# Patient Record
Sex: Male | Born: 1970
Health system: Southern US, Community
[De-identification: ages and names within clinical notes are randomized; demographics above are authoritative.]

## PROBLEM LIST (undated history)

## (undated) DIAGNOSIS — Z87442 Personal history of urinary calculi: Secondary | ICD-10-CM

## (undated) DIAGNOSIS — S8991XA Unspecified injury of right lower leg, initial encounter: Secondary | ICD-10-CM

## (undated) DIAGNOSIS — H7290 Unspecified perforation of tympanic membrane, unspecified ear: Secondary | ICD-10-CM

## (undated) DIAGNOSIS — E785 Hyperlipidemia, unspecified: Secondary | ICD-10-CM

## (undated) DIAGNOSIS — Z91030 Bee allergy status: Secondary | ICD-10-CM

## (undated) HISTORY — PX: EYE SURGERY: SHX253

## (undated) HISTORY — DX: Bee allergy status: Z91.030

## (undated) HISTORY — DX: Unspecified injury of right lower leg, initial encounter: S89.91XA

## (undated) HISTORY — DX: Unspecified perforation of tympanic membrane, unspecified ear: H72.90

## (undated) HISTORY — DX: Personal history of urinary calculi: Z87.442

## (undated) HISTORY — DX: Hyperlipidemia, unspecified: E78.5

---

## 1998-05-11 ENCOUNTER — Emergency Department (HOSPITAL_COMMUNITY): Admission: EM | Admit: 1998-05-11 | Discharge: 1998-05-12 | Payer: Self-pay | Admitting: Emergency Medicine

## 1998-05-12 ENCOUNTER — Encounter: Payer: Self-pay | Admitting: Emergency Medicine

## 2001-03-05 ENCOUNTER — Emergency Department (HOSPITAL_COMMUNITY): Admission: EM | Admit: 2001-03-05 | Discharge: 2001-03-05 | Payer: Self-pay | Admitting: Emergency Medicine

## 2003-10-05 ENCOUNTER — Emergency Department (HOSPITAL_COMMUNITY): Admission: EM | Admit: 2003-10-05 | Discharge: 2003-10-05 | Payer: Self-pay

## 2004-06-07 ENCOUNTER — Ambulatory Visit: Payer: Self-pay | Admitting: Internal Medicine

## 2004-12-29 ENCOUNTER — Ambulatory Visit: Payer: Self-pay | Admitting: Internal Medicine

## 2005-01-03 ENCOUNTER — Ambulatory Visit: Payer: Self-pay | Admitting: Internal Medicine

## 2005-12-01 ENCOUNTER — Ambulatory Visit: Payer: Self-pay | Admitting: Internal Medicine

## 2007-03-17 ENCOUNTER — Encounter: Payer: Self-pay | Admitting: Internal Medicine

## 2007-10-24 ENCOUNTER — Telehealth: Payer: Self-pay | Admitting: Internal Medicine

## 2007-12-12 ENCOUNTER — Ambulatory Visit: Payer: Self-pay | Admitting: Internal Medicine

## 2007-12-12 DIAGNOSIS — M25529 Pain in unspecified elbow: Secondary | ICD-10-CM | POA: Insufficient documentation

## 2007-12-12 DIAGNOSIS — F17200 Nicotine dependence, unspecified, uncomplicated: Secondary | ICD-10-CM | POA: Insufficient documentation

## 2007-12-12 DIAGNOSIS — E785 Hyperlipidemia, unspecified: Secondary | ICD-10-CM | POA: Insufficient documentation

## 2009-07-11 HISTORY — PX: TYMPANOPLASTY: SHX33

## 2009-07-13 ENCOUNTER — Ambulatory Visit: Payer: Self-pay | Admitting: Internal Medicine

## 2009-07-13 LAB — CONVERTED CEMR LAB
ALT: 38 units/L (ref 0–53)
AST: 24 units/L (ref 0–37)
Albumin: 4.1 g/dL (ref 3.5–5.2)
Alkaline Phosphatase: 60 units/L (ref 39–117)
BUN: 15 mg/dL (ref 6–23)
Basophils Absolute: 0.1 10*3/uL (ref 0.0–0.1)
Basophils Relative: 0.6 % (ref 0.0–3.0)
Bilirubin Urine: NEGATIVE
Bilirubin, Direct: 0 mg/dL (ref 0.0–0.3)
CO2: 28 meq/L (ref 19–32)
Calcium: 9.2 mg/dL (ref 8.4–10.5)
Chloride: 105 meq/L (ref 96–112)
Cholesterol: 239 mg/dL — ABNORMAL HIGH (ref 0–200)
Creatinine, Ser: 1 mg/dL (ref 0.4–1.5)
Direct LDL: 143.4 mg/dL
Eosinophils Absolute: 0.2 10*3/uL (ref 0.0–0.7)
Eosinophils Relative: 2.2 % (ref 0.0–5.0)
GFR calc non Af Amer: 88.09 mL/min (ref >60)
Glucose, Bld: 89 mg/dL (ref 70–99)
HCT: 45.7 % (ref 39.0–52.0)
HDL: 43.3 mg/dL (ref >39.00)
Hemoglobin, Urine: NEGATIVE
Hemoglobin: 15.5 g/dL (ref 13.0–17.0)
Ketones, ur: NEGATIVE mg/dL
Leukocytes, UA: NEGATIVE
Lymphocytes Relative: 31.6 % (ref 12.0–46.0)
Lymphs Abs: 2.9 10*3/uL (ref 0.7–4.0)
MCHC: 34 g/dL (ref 30.0–36.0)
MCV: 91 fL (ref 78.0–100.0)
Monocytes Absolute: 1 10*3/uL (ref 0.1–1.0)
Monocytes Relative: 10.7 % (ref 3.0–12.0)
Neutro Abs: 5 10*3/uL (ref 1.4–7.7)
Neutrophils Relative %: 54.9 % (ref 43.0–77.0)
Nitrite: NEGATIVE
Platelets: 205 10*3/uL (ref 150.0–400.0)
Potassium: 3.7 meq/L (ref 3.5–5.1)
RBC: 5.02 M/uL (ref 4.22–5.81)
RDW: 12.5 % (ref 11.5–14.6)
Sodium: 141 meq/L (ref 135–145)
Specific Gravity, Urine: >=1.03 (ref 1.000–1.030)
TSH: 1.42 microintl units/mL (ref 0.35–5.50)
Total Bilirubin: 0.7 mg/dL (ref 0.3–1.2)
Total CHOL/HDL Ratio: 6
Total Protein, Urine: NEGATIVE mg/dL
Total Protein: 7.3 g/dL (ref 6.0–8.3)
Triglycerides: 267 mg/dL — ABNORMAL HIGH (ref 0.0–149.0)
Urine Glucose: NEGATIVE mg/dL
Urobilinogen, UA: 0.2 (ref 0.0–1.0)
VLDL: 53.4 mg/dL — ABNORMAL HIGH (ref 0.0–40.0)
WBC: 9.2 10*3/uL (ref 4.5–10.5)
pH: 5.5 (ref 5.0–8.0)

## 2009-07-17 ENCOUNTER — Ambulatory Visit: Payer: Self-pay | Admitting: Internal Medicine

## 2009-07-17 DIAGNOSIS — G4700 Insomnia, unspecified: Secondary | ICD-10-CM | POA: Insufficient documentation

## 2009-11-20 ENCOUNTER — Telehealth: Payer: Self-pay | Admitting: Internal Medicine

## 2009-12-10 ENCOUNTER — Encounter: Payer: Self-pay | Admitting: Internal Medicine

## 2010-05-31 ENCOUNTER — Ambulatory Visit (HOSPITAL_BASED_OUTPATIENT_CLINIC_OR_DEPARTMENT_OTHER): Admission: RE | Admit: 2010-05-31 | Discharge: 2010-05-31 | Payer: Self-pay | Admitting: Otolaryngology

## 2010-06-02 ENCOUNTER — Telehealth: Payer: Self-pay | Admitting: Internal Medicine

## 2010-06-08 ENCOUNTER — Ambulatory Visit: Payer: Self-pay | Admitting: Internal Medicine

## 2010-06-08 DIAGNOSIS — R059 Cough, unspecified: Secondary | ICD-10-CM | POA: Insufficient documentation

## 2010-06-08 DIAGNOSIS — J45909 Unspecified asthma, uncomplicated: Secondary | ICD-10-CM | POA: Insufficient documentation

## 2010-06-08 DIAGNOSIS — J189 Pneumonia, unspecified organism: Secondary | ICD-10-CM | POA: Insufficient documentation

## 2010-06-08 DIAGNOSIS — R05 Cough: Secondary | ICD-10-CM

## 2010-08-10 NOTE — Assessment & Plan Note (Signed)
Summary: ?asthma related problem/#/cd   Vital Signs:  Patient profile:   40 year old male Height:      72 inches Weight:      210 pounds BMI:     28.58 O2 Sat:      95 % on Room air Temp:     99.3 degrees F oral Pulse rate:   72 / minute Pulse rhythm:   regular Resp:     16 per minute BP sitting:   118 / 76  (left arm) Cuff size:   regular  Vitals Entered By: Lanier Prude, CMA(AAMA) (June 08, 2010 11:23 AM)  O2 Flow:  Room air CC: SOB, dyspnea since having Tympanoplasty last Monday Is Patient Diabetic? No Comments pt is not taking Prednisone or Chantix. He states the Ventolin alone is not helping with symptoms   CC:  SOB and dyspnea since having Tympanoplasty last Monday.  History of Present Illness: C/o asthma exacerbation after his L ear surgery. When he woke up his O2 sat was droping and he was "asked to breath". The patient presents with complaints of  fever, cough of several days duration. Not better with OTC meds. Ventolin helped some. Chest hurts with coughing a little. Can't sleep well due to cough. Muscle aches are not present.  The mucus is colored brown.   Current Medications (verified): 1)  Epipen 2-Pak 0.3 Mg/0.39ml (1:1000)  Devi (Epinephrine Hcl (Anaphylaxis)) .... As Dirr. 2)  Prednisone 10 Mg  Tabs (Prednisone) .... Take 4 Tabs Prn 3)  Chantix Starting Month Pak 0.5 Mg X 11 & 1 Mg X 42 Tabs (Varenicline Tartrate) .... As Dirrected 4)  Chantix Continuing Month Pak 1 Mg Tabs (Varenicline Tartrate) .... As Dirrected 5)  Zolpidem Tartrate 10 Mg Tabs (Zolpidem Tartrate) .... 1/2-1 Tab At Bedtime As Needed Insomnia 6)  Ventolin Hfa 108 (90 Base) Mcg/act Aers (Albuterol Sulfate) .... 2 Inh Up To Qid As Needed Wheezing or Shortness of Breath 7)  Promethazine-Codeine 6.25-10 Mg/12ml Syrp (Promethazine-Codeine) .... 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 he has a Insurance underwriter. Hyperlipidemia Allergy to bee stings Asthma  Past Surgical History: L tympanoplasty 2011  Review of Systems       The patient complains of fever, dyspnea on exertion, and headaches.    Physical Exam  General:  NAD Ears:  NE L with cotton ball in it Nose:  crusty d/c Mouth:  WNL Lungs:  L crackles Heart:  Normal rate and regular rhythm. S1 and S2 normal without gallop, murmur, click, rub or other extra sounds. Abdomen:  Bowel sounds positive,abdomen soft and non-tender without masses, organomegaly or hernias noted. Msk:  No deformity or scoliosis noted of thoracic or lumbar spine.   Neurologic:  No cranial nerve deficits noted. Station and gait are normal. Plantar reflexes are down-going bilaterally. DTRs are symmetrical throughout. Sensory, motor and coordinative functions appear intact. Skin:  Intact without suspicious lesions or rashes   Impression & Recommendations:  Problem # 1:  PNEUMONIA (ICD-486) - poss aspiration L Assessment New  Augmentin x 10 d  Problem # 2:  COUGH (ICD-786.2) Assessment: New  Orders: T-2 View CXR, Same Day (71020.5TC)  Problem # 3:  ASTHMA (ICD-493.90) Assessment: Deteriorated  Ventolin Hfa 108 (90 Base) Mcg/act Aers (Albuterol sulfate) .Marland Kitchen... 2 inh up to qid as needed wheezing or shortness of breath    Symbicort 160-4.5 Mcg/act Aero (Budesonide-formoterol fumarate) .Marland Kitchen... 2 inh two times a day  Complete Medication List: 1)  Epipen 2-pak 0.3 Mg/0.62ml (1:1000) Devi (Epinephrine hcl (anaphylaxis)) .... As dirr. 2)  Prednisone 10 Mg Tabs (Prednisone) .... Take 4 tabs prn 3)  Chantix Starting Month Pak 0.5 Mg X 11 & 1 Mg X 42 Tabs (Varenicline tartrate) .... As dirrected 4)  Chantix Continuing Month Pak 1 Mg Tabs (Varenicline  tartrate) .... As dirrected 5)  Zolpidem Tartrate 10 Mg Tabs (Zolpidem tartrate) .... 1/2-1 tab at bedtime as needed insomnia 6)  Ventolin Hfa 108 (90 Base) Mcg/act Aers (Albuterol sulfate) .... 2 inh up to qid as needed wheezing or shortness of breath 7)  Promethazine-codeine 6.25-10 Mg/20ml Syrp (Promethazine-codeine) .... 5-10 ml by mouth q id as needed cough 8)  Symbicort 160-4.5 Mcg/act Aero (Budesonide-formoterol fumarate) .... 2 inh two times a day 9)  Augmentin 875-125 Mg Tabs (Amoxicillin-pot clavulanate) .Marland Kitchen.. 1 by mouth bid  Patient Instructions: 1)  Call if you are not better in a reasonable amount of time or if worse.  Prescriptions: AUGMENTIN 875-125 MG TABS (AMOXICILLIN-POT CLAVULANATE) 1 by mouth bid  #20 x 0   Entered and Authorized by:   Tresa Garter MD   Signed by:   Tresa Garter MD on 06/08/2010   Method used:   [facility]ron[facility] to        [facility]  [address] [phone]* (retail)       [address]       [place], [place]  [address]       Ph: [phone]       Fax: [phone]   RxID:   [id number] SYMBICORT 160-4.5 MCG/ACT AERO (BUDESONIDE-FORMOTEROL FUMARATE) 2 inh two times a day  #1 x 3   Entered and Authorized by:   Tresa Garter MD   Signed by:   Tresa Garter MD on 06/08/2010   Method used:   [facility]        [facility]  [address] [phone]* (retail)       [address]       [place], [place]  [address]       Ph: [phone]       Fax: [phone]   RxID:   [id number]    Orders Added: 1)  T-2 View CXR, Same Day [71020.5TC] 2)  Est. Patient Level IV [38182]

## 2010-08-10 NOTE — Progress Notes (Signed)
Summary: INHALER  Phone Note Call from Patient Call back at [facility] Phone [phone]   Summary of Call: Patient is requesting refill of ventolin. He c/o cold/cough and slight wheezing. Ok to call in?  Initial call taken by: Lamar Sprinkles, CMA,  Nov 20, 2009 4:04 PM  Follow-up for Phone Call        ok OV if sick Follow-up by: Tresa Garter MD,  Nov 20, 2009 5:00 PM  Additional Follow-up for Phone Call Additional follow up Details #1::        Pt informed  Additional Follow-up by: Lamar Sprinkles, CMA,  Nov 20, 2009 5:53 PM    New/Updated Medications: VENTOLIN HFA 108 (90 BASE) MCG/ACT AERS (ALBUTEROL SULFATE) 2 inh up to qid as needed wheezing or shortness of breath Prescriptions: VENTOLIN HFA 108 (90 BASE) MCG/ACT AERS (ALBUTEROL SULFATE) 2 inh up to qid as needed wheezing or shortness of breath  #1 x 12   Entered by:   Lamar Sprinkles, CMA   Authorized by:   Tresa Garter MD   Signed by:   Lamar Sprinkles, CMA on 11/20/2009   Method used:   [facility]        [facility]  [address] [phone]* (retail)       [address]       [place], [place]  [address]       Ph: [phone]       Fax: [phone]   RxID:   [id number]

## 2010-08-10 NOTE — Consult Note (Signed)
Summary: [facility]  [facility]   Imported By: Lennie Odor 12/15/2009 15:00:04  _____________________________________________________________________  External Attachment:    Type:   Image     Comment:   External Document

## 2010-08-10 NOTE — Assessment & Plan Note (Signed)
Summary: PHYSICAL--STC   Vital Signs:  Patient profile:   40 year old male Height:      72 inches Weight:      214 pounds BMI:     29.13 Temp:     99.2 degrees F oral Pulse rate:   96 / minute BP sitting:   106 / 74  (left arm)  Vitals Entered By: Tora Perches (July 17, 2009 3:07 PM) CC: cpx Is Patient Diabetic? No   CC:  cpx.  History of Present Illness: The patient presents for a wellness examination  C/o insomnia  Preventive Screening-Counseling & Management  Alcohol-Tobacco     Smoking Status: current     Packs/Day: 1.0  Current Medications (verified): 1)  Asmanex 120 Metered Doses 220 Mcg/inh  Aepb (Mometasone Furoate) .... Once Daily 2)  Lorazepam 1 Mg  Tabs (Lorazepam) .... 1/2 or 1 Tab Two Times A Day As Needed Anxiety 3)  Epipen 2-Pak 0.3 Mg/0.19ml (1:1000)  Devi (Epinephrine Hcl (Anaphylaxis)) .... As Dirr. 4)  Prednisone 10 Mg  Tabs (Prednisone) .... Take 4 Tabs Prn 5)  Naprosyn 500 Mg Tabs (Naproxen) .Marland Kitchen.. 1 Two Times A Day Pc Prn 6)  Ventolin Hfa 108 (90 Base) Mcg/act  Aers (Albuterol Sulfate) .... 2 Inh Qid Prn  Allergies: 1)  Crestor  Past History:  Past Medical History: Last updated: 12/12/2007 H/o right knee injury no surgery H/o hole in left eardrum H/o kidney stones in the past. he has a urologist. 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    Physical Exam  General:  NAD Eyes:  No corneal or conjunctival inflammation noted. EOMI. Perrla. Ears:  External ear exam shows no significant lesions or deformities.  Otoscopic examination reveals clear canals, tympanic membranes are intact bilaterally without bulging, retraction, inflammation or discharge. Hearing is grossly normal bilaterally. Nose:  External nasal examination shows no deformity or inflammation. Nasal mucosa are pink and moist without lesions or exudates. Mouth:  Oral mucosa and oropharynx without lesions or exudates.  Teeth in good repair. Neck:  No deformities, masses, or tenderness noted. Lungs:  Normal respiratory effort, chest expands symmetrically. Lungs are clear to auscultation, no crackles or wheezes. Heart:  Normal rate and regular rhythm. S1 and S2 normal without gallop, murmur, click, rub or other extra sounds. Abdomen:  Bowel sounds positive,abdomen soft and non-tender without masses, organomegaly or hernias noted. Genitalia:  Testes bilaterally descended without nodularity, tenderness or masses. No scrotal masses or lesions. No penis lesions or urethral discharge. Msk:  No deformity or scoliosis noted of thoracic or lumbar spine.   Pulses:  R and L carotid,radial,femoral,dorsalis pedis and posterior tibial pulses are full and equal bilaterally Extremities:  No clubbing, cyanosis, edema, or deformity noted with normal full range of motion of all joints.   Neurologic:  No cranial nerve deficits noted. Station and gait are normal.  Plantar reflexes are down-going bilaterally. DTRs are symmetrical throughout. Sensory, motor and coordinative functions appear intact. Skin:  Intact without suspicious lesions or  rashes Cervical Nodes:  No lymphadenopathy noted Inguinal Nodes:  No significant adenopathy Psych:  Cognition and judgment appear intact. Alert and cooperative with normal attention span and concentration. No apparent delusions, illusions, hallucinations   Impression & Recommendations:  Problem # 1:  PHYSICAL EXAMINATION (ICD-V70.0) Assessment Comment Only .Health and age related issues were discussed. Available screening tests and vaccinations were discussed as well. Healthy life style including good diet and execise was discussed.  The labs were reviewed with the patient.   Problem # 2:  INSOMNIA, CHRONIC (ICD-307.42) Assessment: Comment Only Zolpidem prn  Problem # 3:  STING HORNETS WASPS&BEES CAUSE POISN&TOX REACT (ICD-E905.3) Assessment: Comment Only Emergency kit w/sudafed, benadryl, predn  Problem # 4:  TOBACCO USE DISORDER/SMOKER-SMOKING CESSATION DISCUSSED (ICD-305.1) Assessment: Comment Only  His updated medication list for this problem includes:    Chantix Starting Month Pak 0.5 Mg X 11 & 1 Mg X 42 Tabs (Varenicline tartrate) .Marland Kitchen... As dirrected    Chantix Continuing Month Pak 1 Mg Tabs (Varenicline tartrate) .Marland Kitchen... As dirrected  Encouraged smoking cessation and discussed different methods for smoking cessation.   Complete Medication List: 1)  Epipen 2-pak 0.3 Mg/0.45ml (1:1000) Devi (Epinephrine hcl (anaphylaxis)) .... As dirr. 2)  Prednisone 10 Mg Tabs (Prednisone) .... Take 4 tabs prn 3)  Chantix Starting Month Pak 0.5 Mg X 11 & 1 Mg X 42 Tabs (Varenicline tartrate) .... As dirrected 4)  Chantix Continuing Month Pak 1 Mg Tabs (Varenicline tartrate) .... As dirrected 5)  Zolpidem Tartrate 10 Mg Tabs (Zolpidem tartrate) .... 1/2-1 tab at bedtime as needed insomnia  Other Orders: TB Skin Test [phone]) Admin 1st Vaccine (29528) Admin 1st Vaccine [facility]) [phone])  Patient Instructions: 1)  Please schedule a follow-up appointment in 4 months. 2)  Try to eat more raw  plant food, fresh and dry fruit, raw almonds, leafy vegetables, whole foods and less red meat, less animal fat. Poultry and fish is better for you than pork and beef. Avoid processed foods (canned soups, hot dogs, sausage, bacon , frozen dinners). Avoid corn syrup, high fructose syrup or aspartam and Splenda  containing drinks. Honey, Agave and Stevia are better sweeteners. Make your own  dressing with olive oil, wine vinegar, lemon juce, garlic etc. for your salads. 3)  Please schedule a follow-up appointment in 6 months. 4)  Lipid Panel prior to visit, ICD-9:272.0 Prescriptions: PREDNISONE 10 MG  TABS (PREDNISONE) Take 4 tabs prn  #20 x 1   Entered and Authorized by:   Tresa Garter MD   Signed by:   Tresa Garter MD on 07/17/2009   Method used:   Print then Give to Patient   RxID:   [id number] EPIPEN 2-PAK 0.3 MG/0.3ML (1:1000)  DEVI (EPINEPHRINE HCL (ANAPHYLAXIS)) as dirr.  #1 x 3   Entered and Authorized by:   Tresa Garter MD   Signed by:   Tresa Garter MD on 07/17/2009   Method used:   Print then Give to Patient   RxID:   [id number] ZOLPIDEM TARTRATE 10 MG TABS (ZOLPIDEM TARTRATE) 1/2-1 tab at bedtime as needed insomnia  #30 x 6   Entered and Authorized by:   Tresa Garter MD   Signed by:   Tresa Garter MD on 07/17/2009   Method used:   Print then Give to Patient   RxID:  [id number] CHANTIX CONTINUING MONTH PAK 1 MG TABS (VARENICLINE TARTRATE) as dirrected  #1 x 5   Entered and Authorized by:   Tresa Garter MD   Signed by:   Tresa Garter MD on 07/17/2009   Method used:   Print then Give to Patient   RxID:   [id number] CHANTIX STARTING MONTH PAK 0.5 MG X 11 & 1 MG X 42 TABS (VARENICLINE TARTRATE) as dirrected  #1 x 0   Entered and Authorized by:   Tresa Garter MD   Signed by:   Tresa Garter MD on 07/17/2009   Method used:   Print then Give to Patient   RxID:   [id number]    PPD  Application    Vaccine Type: PPD    Site: left forearm    Mfr: Sanofi Pasteur    Dose: 0.1 ml    Route: ID    Given by: Tora Perches    Exp. Date: 11/04/2011    Lot #: [id number]    Appended Document: PHYSICAL--STC    Clinical Lists Changes  Observations: Added new observation of TB PPDRESULT: negative (07/20/2009 14:11) Added new observation of PPD RESULT: < 5mm (07/20/2009 14:11) Added new observation of TB-PPD RDDTE: 07/20/2009 (07/20/2009 14:11)       PPD Results    Date of reading: 07/20/2009    Results: < 5mm    Interpretation: negative

## 2010-08-10 NOTE — Progress Notes (Signed)
Summary: REQ FOR RX  Phone Note Call from Patient Call back at [facility] Phone [phone]   Summary of Call: Pt had tympanoplasty on Monday. Pt is on vicodin & ear drops. Pt c/o chest congestion, wheezing. Has low grade temp 99.3. Patient is requesting rx for inhaler & cough med. He can not come in for office visit.  Initial call taken by: Lamar Sprinkles, CMA,  June 02, 2010 9:58 AM  Follow-up for Phone Call        OK Prom;cod Ventolin OV if sick Follow-up by: Tresa Garter MD,  June 02, 2010 1:10 PM  Additional Follow-up for Phone Call Additional follow up Details #1::        Pt informed  Additional Follow-up by: Lamar Sprinkles, CMA,  June 02, 2010 4:12 PM    New/Updated Medications: PROMETHAZINE-CODEINE 6.25-10 MG/5ML SYRP (PROMETHAZINE-CODEINE) 5-10 ml by mouth q id as needed cough Prescriptions: PROMETHAZINE-CODEINE 6.25-10 MG/5ML SYRP (PROMETHAZINE-CODEINE) 5-10 ml by mouth q id as needed cough  #300 x 0   Entered by:   Lamar Sprinkles, CMA   Authorized by:   Tresa Garter MD   Signed by:   Lamar Sprinkles, CMA on 06/02/2010   Method used:   [facility] to ...       [facility]  [address] [id number]       [address]       [place], [place]  [address]       Ph: [phone]       Fax: [phone]   RxID:   [id number] VENTOLIN HFA 108 (90 BASE) MCG/ACT AERS (ALBUTEROL SULFATE) 2 inh up to qid as needed wheezing or shortness of breath  #1 x 12   Entered by:   Lamar Sprinkles, CMA   Authorized by:   Tresa Garter MD   Signed by:   Lamar Sprinkles, CMA on 06/02/2010   Method used:   [facility]        [facility]  [address] [phone]* (retail)       [address]       [place], [place]  [address]       Ph: [phone]       Fax: [phone]   RxID:   [id number]

## 2010-09-21 LAB — POCT HEMOGLOBIN-HEMACUE: Hemoglobin: 16.7 g/dL (ref 13.0–17.0)

## 2011-04-01 ENCOUNTER — Telehealth: Payer: Self-pay | Admitting: *Deleted

## 2011-04-01 DIAGNOSIS — Z125 Encounter for screening for malignant neoplasm of prostate: Secondary | ICD-10-CM

## 2011-04-01 DIAGNOSIS — Z0000 Encounter for general adult medical examination without abnormal findings: Secondary | ICD-10-CM

## 2011-04-01 NOTE — Telephone Encounter (Signed)
Labs entered.

## 2011-04-01 NOTE — Telephone Encounter (Signed)
Message copied by Merrilyn Puma on Fri Apr 01, 2011  4:54 PM ------      Message from: Newell Coral      Created: Tue Mar 29, 2011  3:35 PM      Regarding: physical sch - needs labs       This pt scheduled his physical for November and is hoping to get labs done the Monday before his physical.              Thanks so much!

## 2011-05-13 ENCOUNTER — Other Ambulatory Visit (INDEPENDENT_AMBULATORY_CARE_PROVIDER_SITE_OTHER): Payer: 59

## 2011-05-13 ENCOUNTER — Other Ambulatory Visit: Payer: Self-pay | Admitting: Internal Medicine

## 2011-05-13 DIAGNOSIS — Z0000 Encounter for general adult medical examination without abnormal findings: Secondary | ICD-10-CM

## 2011-05-13 DIAGNOSIS — Z125 Encounter for screening for malignant neoplasm of prostate: Secondary | ICD-10-CM

## 2011-05-13 LAB — LIPID PANEL
HDL: 46.5 mg/dL (ref >39.00)
Total CHOL/HDL Ratio: 5
VLDL: 67 mg/dL — ABNORMAL HIGH (ref 0.0–40.0)

## 2011-05-13 LAB — URINALYSIS, ROUTINE W REFLEX MICROSCOPIC
Ketones, ur: NEGATIVE
Specific Gravity, Urine: 1.02 (ref 1.000–1.030)
Total Protein, Urine: NEGATIVE
Urine Glucose: NEGATIVE
Urobilinogen, UA: 0.2 (ref 0.0–1.0)
WBC, UA: NONE SEEN (ref 0–?)
pH: 7 (ref 5.0–8.0)

## 2011-05-13 LAB — LDL CHOLESTEROL, DIRECT: Direct LDL: 153.3 mg/dL

## 2011-05-13 LAB — CBC WITH DIFFERENTIAL/PLATELET
Basophils Absolute: 0 10*3/uL (ref 0.0–0.1)
Eosinophils Relative: 2.5 % (ref 0.0–5.0)
HCT: 45 % (ref 39.0–52.0)
Hemoglobin: 15.4 g/dL (ref 13.0–17.0)
Lymphocytes Relative: 31.7 % (ref 12.0–46.0)
Lymphs Abs: 2.7 10*3/uL (ref 0.7–4.0)
Monocytes Relative: 9.7 % (ref 3.0–12.0)
Neutro Abs: 4.7 10*3/uL (ref 1.4–7.7)
RBC: 5.02 Mil/uL (ref 4.22–5.81)
RDW: 12.7 % (ref 11.5–14.6)
WBC: 8.5 10*3/uL (ref 4.5–10.5)

## 2011-05-13 LAB — TSH: TSH: 1.16 u[IU]/mL (ref 0.35–5.50)

## 2011-05-13 LAB — HEPATIC FUNCTION PANEL
AST: 27 U/L (ref 0–37)
Bilirubin, Direct: 0.1 mg/dL (ref 0.0–0.3)
Total Bilirubin: 0.8 mg/dL (ref 0.3–1.2)

## 2011-05-13 LAB — BASIC METABOLIC PANEL
BUN: 18 mg/dL (ref 6–23)
Creatinine, Ser: 1 mg/dL (ref 0.4–1.5)
GFR: 87.29 mL/min (ref >60.00)
Potassium: 4.5 mEq/L (ref 3.5–5.1)

## 2011-05-17 ENCOUNTER — Encounter: Payer: Self-pay | Admitting: Internal Medicine

## 2011-05-18 ENCOUNTER — Ambulatory Visit (INDEPENDENT_AMBULATORY_CARE_PROVIDER_SITE_OTHER): Payer: 59 | Admitting: Internal Medicine

## 2011-05-18 ENCOUNTER — Encounter: Payer: Self-pay | Admitting: Internal Medicine

## 2011-05-18 VITALS — BP 122/80 | HR 76 | Temp 98.6°F | Resp 16 | Ht 72.0 in | Wt 212.0 lb

## 2011-05-18 DIAGNOSIS — E785 Hyperlipidemia, unspecified: Secondary | ICD-10-CM

## 2011-05-18 DIAGNOSIS — J45909 Unspecified asthma, uncomplicated: Secondary | ICD-10-CM

## 2011-05-18 DIAGNOSIS — R5383 Other fatigue: Secondary | ICD-10-CM

## 2011-05-18 DIAGNOSIS — R202 Paresthesia of skin: Secondary | ICD-10-CM

## 2011-05-18 DIAGNOSIS — F17200 Nicotine dependence, unspecified, uncomplicated: Secondary | ICD-10-CM

## 2011-05-18 DIAGNOSIS — Z23 Encounter for immunization: Secondary | ICD-10-CM

## 2011-05-18 DIAGNOSIS — Z0000 Encounter for general adult medical examination without abnormal findings: Secondary | ICD-10-CM

## 2011-05-18 MED ORDER — ZOLPIDEM TARTRATE 10 MG PO TABS: 5.0000 mg | ORAL_TABLET | Freq: Every evening | ORAL | Status: DC | PRN

## 2011-05-18 MED ORDER — VITAMIN D 1000 UNITS PO TABS: 1000.0000 [IU] | ORAL_TABLET | Freq: Every day | ORAL | Status: AC

## 2011-05-18 MED ORDER — OMEGA-3-ACID ETHYL ESTERS 1 G PO CAPS: 2.0000 g | ORAL_CAPSULE | Freq: Two times a day (BID) | ORAL | Status: DC

## 2011-05-18 MED ORDER — ASPIRIN EC 81 MG PO TBEC: 81.0000 mg | DELAYED_RELEASE_TABLET | Freq: Every day | ORAL | Status: AC

## 2011-05-18 NOTE — Progress Notes (Signed)
  Subjective:    Patient ID: Stephen Blankenship, male    DOB: 11-29-1970, 40 y.o.   MRN: [id number]  HPI  The patient is here for a wellness exam. The patient has been doing well overall without major physical or psychological issues going on lately. The patient needs to address  chronic  chronic  hyperlipidemia controlled with medicines as well  Review of Systems  Constitutional: Negative for appetite change, fatigue and unexpected weight change.  HENT: Negative for nosebleeds, congestion, sore throat, sneezing, trouble swallowing and neck pain.   Eyes: Negative for itching and visual disturbance.  Respiratory: Negative for cough.   Cardiovascular: Negative for chest pain, palpitations and leg swelling.  Gastrointestinal: Negative for nausea, diarrhea, blood in stool and abdominal distention.  Genitourinary: Negative for frequency and hematuria.  Musculoskeletal: Negative for back pain, joint swelling and gait problem.  Skin: Negative for rash.  Neurological: Negative for dizziness, tremors, speech difficulty and weakness.  Psychiatric/Behavioral: Negative for sleep disturbance, dysphoric mood and agitation. The patient is not nervous/anxious.        Objective:   Physical Exam  Constitutional: He is oriented to person, place, and time. He appears well-developed.  HENT:  Mouth/Throat: Oropharynx is clear and moist.  Eyes: Conjunctivae are normal. Pupils are equal, round, and reactive to light.  Neck: Normal range of motion. No JVD present. No thyromegaly present.  Cardiovascular: Normal rate, regular rhythm, normal heart sounds and intact distal pulses.  Exam reveals no gallop and no friction rub.   No murmur heard. Pulmonary/Chest: Effort normal and breath sounds normal. No respiratory distress. He has no wheezes. He has no rales. He exhibits no tenderness.  Abdominal: Soft. Bowel sounds are normal. He exhibits no distension and no mass. There is no tenderness. There is no rebound and no  guarding.  Musculoskeletal: Normal range of motion. He exhibits no edema and no tenderness.  Lymphadenopathy:    He has no cervical adenopathy.  Neurological: He is alert and oriented to person, place, and time. He has normal reflexes. No cranial nerve deficit. He exhibits normal muscle tone. Coordination normal.  Skin: Skin is warm and dry. No rash noted.  Psychiatric: He has a normal mood and affect. His behavior is normal. Judgment and thought content normal.   Lab Results  Component Value Date   WBC 8.5 05/13/2011   HGB 15.4 05/13/2011   HCT 45.0 05/13/2011   PLT 248.0 05/13/2011   GLUCOSE 87 05/13/2011   CHOL 255* 05/13/2011   TRIG 335.0* 05/13/2011   HDL 46.50 05/13/2011   LDLDIRECT 153.3 05/13/2011   ALT 50 05/13/2011   AST 27 05/13/2011   NA 141 05/13/2011   K 4.5 05/13/2011   CL 104 05/13/2011   CREATININE 1.0 05/13/2011   BUN 18 05/13/2011   CO2 29 05/13/2011   TSH 1.16 05/13/2011   PSA 1.34 05/13/2011          Assessment & Plan:

## 2011-05-19 ENCOUNTER — Encounter: Payer: Self-pay | Admitting: Internal Medicine

## 2011-05-19 DIAGNOSIS — Z0000 Encounter for general adult medical examination without abnormal findings: Secondary | ICD-10-CM | POA: Insufficient documentation

## 2011-05-19 NOTE — Assessment & Plan Note (Signed)
Try Lovaza

## 2011-05-19 NOTE — Assessment & Plan Note (Signed)
Better  

## 2011-05-19 NOTE — Assessment & Plan Note (Signed)
We discussed age appropriate health related issues, including available/recomended screening tests and vaccinations. We discussed a need for adhering to healthy diet and exercise. Labs/EKG were reviewed/ordered. All questions were answered.   

## 2011-05-19 NOTE — Assessment & Plan Note (Signed)
Doing great on a patch now

## 2011-08-17 ENCOUNTER — Ambulatory Visit: Payer: 59 | Admitting: Internal Medicine

## 2012-07-08 ENCOUNTER — Other Ambulatory Visit: Payer: Self-pay | Admitting: Internal Medicine

## 2012-07-09 NOTE — Telephone Encounter (Signed)
Ok to Rf? 

## 2012-07-12 ENCOUNTER — Telehealth: Payer: Self-pay | Admitting: *Deleted

## 2012-07-12 DIAGNOSIS — Z125 Encounter for screening for malignant neoplasm of prostate: Secondary | ICD-10-CM

## 2012-07-12 DIAGNOSIS — Z0000 Encounter for general adult medical examination without abnormal findings: Secondary | ICD-10-CM

## 2012-07-12 NOTE — Telephone Encounter (Signed)
Rf req for zolpidem 10 mg 1 po qhs. # 90. Ok to Rf? 

## 2012-07-12 NOTE — Telephone Encounter (Signed)
CPE labs entered.  

## 2012-07-12 NOTE — Telephone Encounter (Signed)
OK to fill this prescription with additional refills x1. Sch OV Thank you!  

## 2012-07-12 NOTE — Telephone Encounter (Signed)
Message copied by Merrilyn Puma on Thu Jul 12, 2012  9:07 AM ------      Message from: Etheleen Sia      Created: Thu Jul 12, 2012  8:34 AM      Regarding: LAB       PHYSICAL LABS FOR JAN 15 APPT

## 2012-07-13 ENCOUNTER — Other Ambulatory Visit: Payer: Self-pay | Admitting: *Deleted

## 2012-07-13 NOTE — Telephone Encounter (Signed)
Done

## 2012-07-17 MED ORDER — ZOLPIDEM TARTRATE 10 MG PO TABS: 10.0000 mg | ORAL_TABLET | Freq: Every evening | ORAL | Status: DC | PRN

## 2012-07-17 NOTE — Telephone Encounter (Signed)
Needs ov

## 2012-07-20 ENCOUNTER — Other Ambulatory Visit (INDEPENDENT_AMBULATORY_CARE_PROVIDER_SITE_OTHER): Payer: 59

## 2012-07-20 DIAGNOSIS — R5381 Other malaise: Secondary | ICD-10-CM

## 2012-07-20 DIAGNOSIS — R209 Unspecified disturbances of skin sensation: Secondary | ICD-10-CM

## 2012-07-20 DIAGNOSIS — Z0000 Encounter for general adult medical examination without abnormal findings: Secondary | ICD-10-CM

## 2012-07-20 DIAGNOSIS — R5383 Other fatigue: Secondary | ICD-10-CM

## 2012-07-20 DIAGNOSIS — Z125 Encounter for screening for malignant neoplasm of prostate: Secondary | ICD-10-CM

## 2012-07-20 DIAGNOSIS — R202 Paresthesia of skin: Secondary | ICD-10-CM

## 2012-07-20 LAB — BASIC METABOLIC PANEL
BUN: 19 mg/dL (ref 6–23)
CO2: 30 mEq/L (ref 19–32)
Calcium: 9.4 mg/dL (ref 8.4–10.5)
Glucose, Bld: 89 mg/dL (ref 70–99)
Sodium: 141 mEq/L (ref 135–145)

## 2012-07-20 LAB — URINALYSIS, ROUTINE W REFLEX MICROSCOPIC
Bilirubin Urine: NEGATIVE
Ketones, ur: NEGATIVE
Leukocytes, UA: NEGATIVE
Specific Gravity, Urine: >=1.03 (ref 1.000–1.030)
Urobilinogen, UA: 0.2 (ref 0.0–1.0)

## 2012-07-20 LAB — HEPATIC FUNCTION PANEL
AST: 19 U/L (ref 0–37)
Albumin: 4.3 g/dL (ref 3.5–5.2)
Alkaline Phosphatase: 60 U/L (ref 39–117)
Total Protein: 7 g/dL (ref 6.0–8.3)

## 2012-07-20 LAB — CBC WITH DIFFERENTIAL/PLATELET
Basophils Relative: 0.4 % (ref 0.0–3.0)
Eosinophils Relative: 2.5 % (ref 0.0–5.0)
HCT: 44.1 % (ref 39.0–52.0)
Hemoglobin: 14.8 g/dL (ref 13.0–17.0)
MCV: 87.2 fl (ref 78.0–100.0)
Monocytes Absolute: 0.8 10*3/uL (ref 0.1–1.0)
Neutro Abs: 4 10*3/uL (ref 1.4–7.7)
Neutrophils Relative %: 53.2 % (ref 43.0–77.0)
RBC: 5.06 Mil/uL (ref 4.22–5.81)
WBC: 7.6 10*3/uL (ref 4.5–10.5)

## 2012-07-20 LAB — LIPID PANEL
Cholesterol: 261 mg/dL — ABNORMAL HIGH (ref 0–200)
Triglycerides: 269 mg/dL — ABNORMAL HIGH (ref 0.0–149.0)

## 2012-07-20 LAB — SEDIMENTATION RATE: Sed Rate: 7 mm/hr (ref 0–22)

## 2012-07-25 ENCOUNTER — Ambulatory Visit (INDEPENDENT_AMBULATORY_CARE_PROVIDER_SITE_OTHER): Payer: 59 | Admitting: Internal Medicine

## 2012-07-25 ENCOUNTER — Encounter: Payer: Self-pay | Admitting: Internal Medicine

## 2012-07-25 VITALS — BP 116/74 | HR 79 | Temp 98.8°F | Resp 16 | Ht 72.0 in | Wt 220.5 lb

## 2012-07-25 DIAGNOSIS — R5381 Other malaise: Secondary | ICD-10-CM

## 2012-07-25 DIAGNOSIS — Z0000 Encounter for general adult medical examination without abnormal findings: Secondary | ICD-10-CM

## 2012-07-25 DIAGNOSIS — G4700 Insomnia, unspecified: Secondary | ICD-10-CM

## 2012-07-25 DIAGNOSIS — R5383 Other fatigue: Secondary | ICD-10-CM

## 2012-07-25 DIAGNOSIS — R7989 Other specified abnormal findings of blood chemistry: Secondary | ICD-10-CM

## 2012-07-25 DIAGNOSIS — E291 Testicular hypofunction: Secondary | ICD-10-CM

## 2012-07-25 DIAGNOSIS — D485 Neoplasm of uncertain behavior of skin: Secondary | ICD-10-CM | POA: Insufficient documentation

## 2012-07-25 DIAGNOSIS — E785 Hyperlipidemia, unspecified: Secondary | ICD-10-CM

## 2012-07-25 MED ORDER — ZOLPIDEM TARTRATE 10 MG PO TABS: 10.0000 mg | ORAL_TABLET | Freq: Every evening | ORAL | Status: DC | PRN

## 2012-07-25 MED ORDER — VITAMIN D 1000 UNITS PO TABS: 1000.0000 [IU] | ORAL_TABLET | Freq: Every day | ORAL | Status: AC

## 2012-07-25 MED ORDER — EPINEPHRINE 0.3 MG/0.3ML IJ DEVI: 0.3000 mg | Freq: Once | INTRAMUSCULAR | Status: DC

## 2012-07-25 MED ORDER — ERGOCALCIFEROL 1.25 MG (50000 UT) PO CAPS: 50000.0000 [IU] | ORAL_CAPSULE | ORAL | Status: DC

## 2012-07-25 MED ORDER — PREDNISONE 10 MG PO TABS: 40.0000 mg | ORAL_TABLET | Freq: Every day | ORAL | Status: DC | PRN

## 2012-07-25 MED ORDER — ALBUTEROL SULFATE HFA 108 (90 BASE) MCG/ACT IN AERS: 2.0000 | INHALATION_SPRAY | Freq: Four times a day (QID) | RESPIRATORY_TRACT | Status: DC

## 2012-07-25 NOTE — Assessment & Plan Note (Signed)
He will sch a derm cons

## 2012-07-25 NOTE — Progress Notes (Signed)
   Subjective:     HPI  The patient is here for a wellness exam. The patient has been doing well overall without major physical or psychological issues going on lately. The patient needs to address  chronic  chronic  hyperlipidemia controlled with medicines as well  Wt Readings from Last 3 Encounters:  07/25/12 220 lb 8 oz (100.018 kg)  05/18/11 212 lb (96.163 kg)  06/08/10 210 lb (95.255 kg)   BP Readings from Last 3 Encounters:  07/25/12 116/74  05/18/11 122/80  06/08/10 118/76     Review of Systems  Constitutional: Negative for appetite change, fatigue and unexpected weight change.  HENT: Negative for nosebleeds, congestion, sore throat, sneezing, trouble swallowing and neck pain.   Eyes: Negative for itching and visual disturbance.  Respiratory: Negative for cough.   Cardiovascular: Negative for chest pain, palpitations and leg swelling.  Gastrointestinal: Negative for nausea, diarrhea, blood in stool and abdominal distention.  Genitourinary: Negative for frequency and hematuria.  Musculoskeletal: Negative for back pain, joint swelling and gait problem.  Skin: Negative for rash.  Neurological: Negative for dizziness, tremors, speech difficulty and weakness.  Psychiatric/Behavioral: Negative for sleep disturbance, dysphoric mood and agitation. The patient is not nervous/anxious.        Objective:   Physical Exam  Constitutional: He is oriented to person, place, and time. He appears well-developed.  HENT:  Mouth/Throat: Oropharynx is clear and moist.  Eyes: Conjunctivae are normal. Pupils are equal, round, and reactive to light.  Neck: Normal range of motion. No JVD present. No thyromegaly present.  Cardiovascular: Normal rate, regular rhythm, normal heart sounds and intact distal pulses.  Exam reveals no gallop and no friction rub.   No murmur heard. Pulmonary/Chest: Effort normal and breath sounds normal. No respiratory distress. He has no wheezes. He has no rales.  He exhibits no tenderness.  Abdominal: Soft. Bowel sounds are normal. He exhibits no distension and no mass. There is no tenderness. There is no rebound and no guarding.  Musculoskeletal: Normal range of motion. He exhibits no edema and no tenderness.  Lymphadenopathy:    He has no cervical adenopathy.  Neurological: He is alert and oriented to person, place, and time. He has normal reflexes. No cranial nerve deficit. He exhibits normal muscle tone. Coordination normal.  Skin: Skin is warm and dry. No rash noted.  Psychiatric: He has a normal mood and affect. His behavior is normal. Judgment and thought content normal.   Lab Results  Component Value Date   WBC 8.5 05/13/2011   HGB 15.4 05/13/2011   HCT 45.0 05/13/2011   PLT 248.0 05/13/2011   GLUCOSE 87 05/13/2011   CHOL 255* 05/13/2011   TRIG 335.0* 05/13/2011   HDL 46.50 05/13/2011   LDLDIRECT 153.3 05/13/2011   ALT 50 05/13/2011   AST 27 05/13/2011   NA 141 05/13/2011   K 4.5 05/13/2011   CL 104 05/13/2011   CREATININE 1.0 05/13/2011   BUN 18 05/13/2011   CO2 29 05/13/2011   TSH 1.16 05/13/2011   PSA 1.34 05/13/2011          Assessment & Plan:

## 2012-07-25 NOTE — Assessment & Plan Note (Signed)
Continue with current prescription therapy as reflected on the Med list.  

## 2012-07-29 DIAGNOSIS — R7989 Other specified abnormal findings of blood chemistry: Secondary | ICD-10-CM | POA: Insufficient documentation

## 2012-07-29 NOTE — Assessment & Plan Note (Signed)
1/14 asymptomatic Will get more labs

## 2012-07-29 NOTE — Assessment & Plan Note (Signed)
We discussed age appropriate health related issues, including available/recomended screening tests and vaccinations. We discussed a need for adhering to healthy diet and exercise. Labs/EKG were reviewed/ordered. All questions were answered.   

## 2012-08-27 ENCOUNTER — Encounter (INDEPENDENT_AMBULATORY_CARE_PROVIDER_SITE_OTHER): Payer: Self-pay | Admitting: General Surgery

## 2012-08-30 ENCOUNTER — Ambulatory Visit (INDEPENDENT_AMBULATORY_CARE_PROVIDER_SITE_OTHER): Payer: 59 | Admitting: General Surgery

## 2012-09-25 ENCOUNTER — Encounter (INDEPENDENT_AMBULATORY_CARE_PROVIDER_SITE_OTHER): Payer: Self-pay | Admitting: General Surgery

## 2012-09-25 ENCOUNTER — Ambulatory Visit (INDEPENDENT_AMBULATORY_CARE_PROVIDER_SITE_OTHER): Payer: 59 | Admitting: General Surgery

## 2012-09-25 VITALS — BP 120/80 | HR 80 | Temp 97.8°F | Resp 16 | Ht 72.0 in | Wt 219.0 lb

## 2012-09-25 DIAGNOSIS — D1779 Benign lipomatous neoplasm of other sites: Secondary | ICD-10-CM

## 2012-09-25 DIAGNOSIS — D171 Benign lipomatous neoplasm of skin and subcutaneous tissue of trunk: Secondary | ICD-10-CM

## 2012-09-25 NOTE — Progress Notes (Signed)
Patient ID: Stephen Blankenship, male   DOB: [birth year], 42 y.o.   MRN: [id number]  Chief Complaint  Patient presents with  . Lipoma    HPI Stephen Blankenship is a 42 y.o. male.  The patient is a 42 year old male who is referred by Dr. Fredrik Cove for evaluation of back lipoma. The patient is unsure along the back mass has been there. He states his wife it has gotten bigger. 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 HPI  Past Medical History  Diagnosis Date  . Right knee injury   . Ear drum perforation     left  . History of kidney stones     has urologist  . Hyperlipidemia   . Allergy to bee sting   . Asthma     Past Surgical History  Procedure Laterality Date  . Tympanoplasty  2011    Left  . Eye surgery      Family History  Problem Relation Age of Onset  . Coronary artery disease Neg Hx   . Hyperlipidemia Other     Social History History  Substance Use Topics  . Smoking status: Former Smoker    Quit date: 03/15/2011  . Smokeless tobacco: Not on file  . Alcohol Use: 1.8 oz/week    3 Glasses of wine per week    Allergies  Allergen Reactions  . Rosuvastatin     REACTION: myalgias    Current Outpatient Prescriptions  Medication Sig Dispense Refill  . albuterol (PROVENTIL HFA;VENTOLIN HFA) 108 (90 BASE) MCG/ACT inhaler Inhale 2 puffs into the lungs 4 (four) times daily.  1 Inhaler  3  . budesonide-formoterol (SYMBICORT) 160-4.5 MCG/ACT inhaler Inhale 2 puffs into the lungs 2 (two) times daily.        . cholecalciferol (VITAMIN D) 1000 UNITS tablet Take 1 tablet (1,000 Units total) by mouth daily.  100 tablet  3  . EPINEPHrine (EPIPEN 2-PAK) 0.3 mg/0.3 mL DEVI Inject 0.3 mLs (0.3 mg total) into the muscle once.  1 Device  3  . ergocalciferol (VITAMIN D2) 50000 UNITS capsule Take 1 capsule (50,000 Units total) by mouth once a week.  6 capsule  0  . omega-3 acid ethyl esters (LOVAZA) 1 G capsule Take 2 capsules (2 g total) by mouth 2 (two) times daily.   120 capsule  11  . predniSONE (DELTASONE) 10 MG tablet Take 4 tablets (40 mg total) by mouth daily as needed.  20 tablet  1  . zolpidem (AMBIEN) 10 MG tablet Take 1 tablet (10 mg total) by mouth at bedtime as needed for sleep.  90 tablet  1   No current facility-administered medications for this visit.    Review of Systems Review of Systems  Constitutional: Negative.   HENT: Negative.   Eyes: Negative.   Respiratory: Negative.   Cardiovascular: Negative.   Gastrointestinal: Negative.   Neurological: Negative.     There were no vitals taken for this visit.  Physical Exam Physical Exam  Constitutional: He is oriented to person, place, and time. He appears well-developed and well-nourished.  HENT:  Head: Normocephalic and atraumatic.  Eyes: Conjunctivae and EOM are normal. Pupils are equal, round, and reactive to light.  Neck: Normal range of motion.  Cardiovascular: Normal rate, regular rhythm and normal heart sounds.   Pulmonary/Chest: Effort normal and breath sounds normal.    Abdominal: Soft. Bowel sounds are normal.  Musculoskeletal: Normal range of motion.  Neurological: He is alert and  oriented to person, place, and time.    Data Reviewed none  Assessment    A 42 year old male with a left back mass.     Plan    1 we will proceed to the operative for excision of a left back mass.  2. All risks and benefits were discussed with the patient to include infection bleeding and recurrence. Patient understood these risks and wished to proceed with excision of a left back and        Marigene Ehlers., Jed Limerick 09/25/2012, 2:17 PM

## 2012-11-07 ENCOUNTER — Other Ambulatory Visit (INDEPENDENT_AMBULATORY_CARE_PROVIDER_SITE_OTHER): Payer: Self-pay | Admitting: General Surgery

## 2012-11-07 DIAGNOSIS — D1739 Benign lipomatous neoplasm of skin and subcutaneous tissue of other sites: Secondary | ICD-10-CM

## 2012-11-08 ENCOUNTER — Telehealth (INDEPENDENT_AMBULATORY_CARE_PROVIDER_SITE_OTHER): Payer: Self-pay | Admitting: General Surgery

## 2012-11-08 NOTE — Telephone Encounter (Signed)
Spoke with patient he is aware of appt on 11/22/12 for po f/u

## 2012-11-22 ENCOUNTER — Ambulatory Visit (INDEPENDENT_AMBULATORY_CARE_PROVIDER_SITE_OTHER): Payer: 59 | Admitting: General Surgery

## 2012-11-22 ENCOUNTER — Encounter (INDEPENDENT_AMBULATORY_CARE_PROVIDER_SITE_OTHER): Payer: Self-pay | Admitting: General Surgery

## 2012-11-22 VITALS — BP 116/64 | HR 72 | Resp 18 | Ht 72.0 in | Wt 213.0 lb

## 2012-11-22 DIAGNOSIS — D171 Benign lipomatous neoplasm of skin and subcutaneous tissue of trunk: Secondary | ICD-10-CM

## 2012-11-22 DIAGNOSIS — D1779 Benign lipomatous neoplasm of other sites: Secondary | ICD-10-CM

## 2012-11-22 NOTE — Progress Notes (Signed)
Patient ID: Stephen Blankenship, male   DOB: Aug 15, 1970, 42 y.o.   MRN: [id number] The patient is a 42 year old male status post left back lipoma removal. The patient has been doing well postoperatively. The patient did have an episode of pain around the incision but resolved in 1 day.  Pathology revealed benign mature fat. This was discussed with the patient.  On exam: Wounds are clean dry and intact  Assessment and plan: Patient followup as needed

## 2012-12-24 ENCOUNTER — Telehealth: Payer: Self-pay | Admitting: Internal Medicine

## 2012-12-24 NOTE — Telephone Encounter (Signed)
Received 3 pages from [facility], sent to Dr. Posey Rea. 12/24/12/ss

## 2013-03-06 ENCOUNTER — Other Ambulatory Visit: Payer: Self-pay | Admitting: Internal Medicine

## 2013-03-07 NOTE — Telephone Encounter (Signed)
Ok to refill? Last OV 1.15.14 Last filled 1.15.14

## 2013-06-17 ENCOUNTER — Telehealth: Payer: Self-pay | Admitting: Internal Medicine

## 2013-06-17 NOTE — Telephone Encounter (Signed)
Rec'd from [facility] forward 3 pages to Dr. Plotnikov °

## 2013-07-09 ENCOUNTER — Ambulatory Visit (INDEPENDENT_AMBULATORY_CARE_PROVIDER_SITE_OTHER): Payer: 59 | Admitting: Internal Medicine

## 2013-07-09 ENCOUNTER — Encounter: Payer: Self-pay | Admitting: Internal Medicine

## 2013-07-09 ENCOUNTER — Other Ambulatory Visit: Payer: 59

## 2013-07-09 VITALS — BP 110/72 | HR 76 | Temp 98.8°F | Resp 16 | Wt 204.0 lb

## 2013-07-09 DIAGNOSIS — E291 Testicular hypofunction: Secondary | ICD-10-CM

## 2013-07-09 DIAGNOSIS — R7989 Other specified abnormal findings of blood chemistry: Secondary | ICD-10-CM

## 2013-07-09 DIAGNOSIS — F4329 Adjustment disorder with other symptoms: Secondary | ICD-10-CM | POA: Insufficient documentation

## 2013-07-09 DIAGNOSIS — F438 Other reactions to severe stress: Secondary | ICD-10-CM

## 2013-07-09 DIAGNOSIS — N529 Male erectile dysfunction, unspecified: Secondary | ICD-10-CM | POA: Insufficient documentation

## 2013-07-09 MED ORDER — OSELTAMIVIR PHOSPHATE 75 MG PO CAPS: 75.0000 mg | ORAL_CAPSULE | Freq: Two times a day (BID) | ORAL | Status: DC

## 2013-07-09 MED ORDER — TADALAFIL 20 MG PO TABS: 20.0000 mg | ORAL_TABLET | Freq: Every day | ORAL | Status: DC | PRN

## 2013-07-09 MED ORDER — BUPROPION HCL ER (XL) 150 MG PO TB24: 150.0000 mg | ORAL_TABLET | Freq: Every day | ORAL | Status: DC

## 2013-07-09 NOTE — Assessment & Plan Note (Signed)
We can try Wellbutrin XL

## 2013-07-09 NOTE — Patient Instructions (Signed)

## 2013-07-09 NOTE — Progress Notes (Signed)
Pre visit review using our clinic review tool, if applicable. No additional management support is needed unless otherwise documented below in the visit note. 

## 2013-07-09 NOTE — Progress Notes (Signed)
   Subjective:     HPI  The patient is here for a wellness exam.   C/o ED  He is separated now  The patient needs to address  chronic  chronic  hyperlipidemia controlled with medicines as well  Wt Readings from Last 3 Encounters:  07/09/13 204 lb (92.534 kg)  11/22/12 213 lb (96.616 kg)  09/25/12 219 lb (99.338 kg)   BP Readings from Last 3 Encounters:  07/09/13 110/72  11/22/12 116/64  09/25/12 120/80     Review of Systems  Constitutional: Negative for appetite change, fatigue and unexpected weight change.  HENT: Negative for nosebleeds, congestion, sore throat, sneezing, trouble swallowing and neck pain.   Eyes: Negative for itching and visual disturbance.  Respiratory: Negative for cough.   Cardiovascular: Negative for chest pain, palpitations and leg swelling.  Gastrointestinal: Negative for nausea, diarrhea, blood in stool and abdominal distention.  Genitourinary: Negative for frequency and hematuria.  Musculoskeletal: Negative for back pain, joint swelling and gait problem.  Skin: Negative for rash.  Neurological: Negative for dizziness, tremors, speech difficulty and weakness.  Psychiatric/Behavioral: Negative for sleep disturbance, dysphoric mood and agitation. The patient is not nervous/anxious.        Objective:   Physical Exam  Constitutional: He is oriented to person, place, and time. He appears well-developed.  HENT:  Mouth/Throat: Oropharynx is clear and moist.  Eyes: Conjunctivae are normal. Pupils are equal, round, and reactive to light.  Neck: Normal range of motion. No JVD present. No thyromegaly present.  Cardiovascular: Normal rate, regular rhythm, normal heart sounds and intact distal pulses.  Exam reveals no gallop and no friction rub.   No murmur heard. Pulmonary/Chest: Effort normal and breath sounds normal. No respiratory distress. He has no wheezes. He has no rales. He exhibits no tenderness.  Abdominal: Soft. Bowel sounds are normal. He  exhibits no distension and no mass. There is no tenderness. There is no rebound and no guarding.  Musculoskeletal: Normal range of motion. He exhibits no edema and no tenderness.  Lymphadenopathy:    He has no cervical adenopathy.  Neurological: He is alert and oriented to person, place, and time. He has normal reflexes. No cranial nerve deficit. He exhibits normal muscle tone. Coordination normal.  Skin: Skin is warm and dry. No rash noted.  Psychiatric: He has a normal mood and affect. His behavior is normal. Judgment and thought content normal.   Lab Results  Component Value Date   WBC 8.5 05/13/2011   HGB 15.4 05/13/2011   HCT 45.0 05/13/2011   PLT 248.0 05/13/2011   GLUCOSE 87 05/13/2011   CHOL 255* 05/13/2011   TRIG 335.0* 05/13/2011   HDL 46.50 05/13/2011   LDLDIRECT 153.3 05/13/2011   ALT 50 05/13/2011   AST 27 05/13/2011   NA 141 05/13/2011   K 4.5 05/13/2011   CL 104 05/13/2011   CREATININE 1.0 05/13/2011   BUN 18 05/13/2011   CO2 29 05/13/2011   TSH 1.16 05/13/2011   PSA 1.34 05/13/2011          Assessment & Plan:

## 2013-07-11 NOTE — Assessment & Plan Note (Signed)
Discussed Repeat labs

## 2013-07-11 NOTE — Assessment & Plan Note (Signed)
Discussed etiology Labs

## 2013-08-20 ENCOUNTER — Telehealth: Payer: Self-pay

## 2013-08-20 DIAGNOSIS — Z3009 Encounter for other general counseling and advice on contraception: Secondary | ICD-10-CM

## 2013-08-20 NOTE — Telephone Encounter (Signed)
OK. Done Thx 

## 2013-08-20 NOTE — Telephone Encounter (Signed)
The patient called and is hoping to get vasectomy.  He is hoping to get a referral for this procedure.    Callback - [phone]

## 2013-08-20 NOTE — Telephone Encounter (Signed)
Please advise 

## 2013-09-21 ENCOUNTER — Other Ambulatory Visit: Payer: Self-pay | Admitting: Internal Medicine

## 2013-10-04 ENCOUNTER — Other Ambulatory Visit: Payer: Self-pay | Admitting: Internal Medicine

## 2013-11-06 ENCOUNTER — Encounter: Payer: Self-pay | Admitting: Internal Medicine

## 2013-11-06 ENCOUNTER — Other Ambulatory Visit (INDEPENDENT_AMBULATORY_CARE_PROVIDER_SITE_OTHER): Payer: 59

## 2013-11-06 ENCOUNTER — Ambulatory Visit (INDEPENDENT_AMBULATORY_CARE_PROVIDER_SITE_OTHER): Payer: 59 | Admitting: Internal Medicine

## 2013-11-06 VITALS — BP 130/74 | HR 76 | Temp 99.1°F | Resp 16 | Wt 196.0 lb

## 2013-11-06 DIAGNOSIS — N529 Male erectile dysfunction, unspecified: Secondary | ICD-10-CM

## 2013-11-06 DIAGNOSIS — Z7721 Contact with and (suspected) exposure to potentially hazardous body fluids: Secondary | ICD-10-CM

## 2013-11-06 DIAGNOSIS — F17200 Nicotine dependence, unspecified, uncomplicated: Secondary | ICD-10-CM

## 2013-11-06 DIAGNOSIS — G4700 Insomnia, unspecified: Secondary | ICD-10-CM

## 2013-11-06 DIAGNOSIS — E291 Testicular hypofunction: Secondary | ICD-10-CM

## 2013-11-06 DIAGNOSIS — R7989 Other specified abnormal findings of blood chemistry: Secondary | ICD-10-CM

## 2013-11-06 DIAGNOSIS — F4329 Adjustment disorder with other symptoms: Secondary | ICD-10-CM

## 2013-11-06 DIAGNOSIS — D485 Neoplasm of uncertain behavior of skin: Secondary | ICD-10-CM

## 2013-11-06 DIAGNOSIS — Z0000 Encounter for general adult medical examination without abnormal findings: Secondary | ICD-10-CM

## 2013-11-06 DIAGNOSIS — J45909 Unspecified asthma, uncomplicated: Secondary | ICD-10-CM

## 2013-11-06 DIAGNOSIS — F438 Other reactions to severe stress: Secondary | ICD-10-CM

## 2013-11-06 DIAGNOSIS — F4389 Other reactions to severe stress: Secondary | ICD-10-CM

## 2013-11-06 LAB — LIPID PANEL
CHOL/HDL RATIO: 4
Cholesterol: 227 mg/dL — ABNORMAL HIGH (ref 0–200)
HDL: 51.4 mg/dL (ref >39.00)
LDL CALC: 148 mg/dL — AB (ref 0–99)
TRIGLYCERIDES: 139 mg/dL (ref 0.0–149.0)
VLDL: 27.8 mg/dL (ref 0.0–40.0)

## 2013-11-06 LAB — HEPATIC FUNCTION PANEL
ALK PHOS: 64 U/L (ref 39–117)
ALT: 23 U/L (ref 0–53)
AST: 15 U/L (ref 0–37)
Albumin: 4.6 g/dL (ref 3.5–5.2)
BILIRUBIN DIRECT: 0.1 mg/dL (ref 0.0–0.3)
BILIRUBIN TOTAL: 1.1 mg/dL (ref 0.3–1.2)
TOTAL PROTEIN: 7.6 g/dL (ref 6.0–8.3)

## 2013-11-06 LAB — URINALYSIS
Bilirubin Urine: NEGATIVE
Hgb urine dipstick: NEGATIVE
Ketones, ur: NEGATIVE
Leukocytes, UA: NEGATIVE
NITRITE: NEGATIVE
Specific Gravity, Urine: >=1.03 — AB (ref 1.000–1.030)
TOTAL PROTEIN, URINE-UPE24: NEGATIVE
URINE GLUCOSE: NEGATIVE
Urobilinogen, UA: 0.2 (ref 0.0–1.0)
pH: 5.5 (ref 5.0–8.0)

## 2013-11-06 LAB — BASIC METABOLIC PANEL
BUN: 15 mg/dL (ref 6–23)
CALCIUM: 9.7 mg/dL (ref 8.4–10.5)
CHLORIDE: 100 meq/L (ref 96–112)
CO2: 29 meq/L (ref 19–32)
Creatinine, Ser: 0.9 mg/dL (ref 0.4–1.5)
GFR: 93.8 mL/min (ref >60.00)
Glucose, Bld: 87 mg/dL (ref 70–99)
Potassium: 4 mEq/L (ref 3.5–5.1)
SODIUM: 137 meq/L (ref 135–145)

## 2013-11-06 LAB — CBC WITH DIFFERENTIAL/PLATELET
BASOS ABS: 0 10*3/uL (ref 0.0–0.1)
Basophils Relative: 0.4 % (ref 0.0–3.0)
EOS ABS: 0.1 10*3/uL (ref 0.0–0.7)
Eosinophils Relative: 0.9 % (ref 0.0–5.0)
HCT: 45.5 % (ref 39.0–52.0)
Hemoglobin: 15.4 g/dL (ref 13.0–17.0)
Lymphocytes Relative: 19.8 % (ref 12.0–46.0)
Lymphs Abs: 2 10*3/uL (ref 0.7–4.0)
MCHC: 33.8 g/dL (ref 30.0–36.0)
MCV: 88.2 fl (ref 78.0–100.0)
MONOS PCT: 9.1 % (ref 3.0–12.0)
Monocytes Absolute: 0.9 10*3/uL (ref 0.1–1.0)
Neutro Abs: 6.9 10*3/uL (ref 1.4–7.7)
Neutrophils Relative %: 69.8 % (ref 43.0–77.0)
PLATELETS: 271 10*3/uL (ref 150.0–400.0)
RBC: 5.16 Mil/uL (ref 4.22–5.81)
RDW: 12.8 % (ref 11.5–14.6)
WBC: 9.9 10*3/uL (ref 4.5–10.5)

## 2013-11-06 LAB — TESTOSTERONE: Testosterone: 194.13 ng/dL — ABNORMAL LOW (ref 350.00–890.00)

## 2013-11-06 LAB — TSH: TSH: 1.22 u[IU]/mL (ref 0.35–5.50)

## 2013-11-06 MED ORDER — ZOLPIDEM TARTRATE 10 MG PO TABS: 10.0000 mg | ORAL_TABLET | Freq: Every evening | ORAL | Status: DC | PRN

## 2013-11-06 MED ORDER — TADALAFIL 20 MG PO TABS: 20.0000 mg | ORAL_TABLET | Freq: Every day | ORAL | Status: DC | PRN

## 2013-11-06 NOTE — Assessment & Plan Note (Signed)
We discussed age appropriate health related issues, including available/recomended screening tests and vaccinations. We discussed a need for adhering to healthy diet and exercise. Labs/EKG were reviewed/ordered. All questions were answered.   

## 2013-11-06 NOTE — Progress Notes (Signed)
Pre visit review using our clinic review tool, if applicable. No additional management support is needed unless otherwise documented below in the visit note. 

## 2013-11-06 NOTE — Assessment & Plan Note (Addendum)
Doing well 

## 2013-11-06 NOTE — Assessment & Plan Note (Signed)
Continue with current prescription therapy as reflected on the Med list.  

## 2013-11-06 NOTE — Assessment & Plan Note (Signed)
Doing well 

## 2013-11-06 NOTE — Progress Notes (Signed)
   Subjective:     HPI  The patient is here for a wellness exam.   F/u ED  He is separated now  The patient needs to address  chronic  chronic  hyperlipidemia controlled with medicines as well  Wt Readings from Last 3 Encounters:  11/06/13 196 lb (88.905 kg)  07/09/13 204 lb (92.534 kg)  11/22/12 213 lb (96.616 kg)   BP Readings from Last 3 Encounters:  11/06/13 130/74  07/09/13 110/72  11/22/12 116/64     Review of Systems  Constitutional: Negative for appetite change, fatigue and unexpected weight change.  HENT: Negative for nosebleeds, congestion, sore throat, sneezing, trouble swallowing and neck pain.   Eyes: Negative for itching and visual disturbance.  Respiratory: Negative for cough.   Cardiovascular: Negative for chest pain, palpitations and leg swelling.  Gastrointestinal: Negative for nausea, diarrhea, blood in stool and abdominal distention.  Genitourinary: Negative for frequency and hematuria.  Musculoskeletal: Negative for back pain, joint swelling and gait problem.  Skin: Negative for rash.  Neurological: Negative for dizziness, tremors, speech difficulty and weakness.  Psychiatric/Behavioral: Negative for sleep disturbance, dysphoric mood and agitation. The patient is not nervous/anxious.        Objective:   Physical Exam  Constitutional: He is oriented to person, place, and time. He appears well-developed.  HENT:  Mouth/Throat: Oropharynx is clear and moist.  Eyes: Conjunctivae are normal. Pupils are equal, round, and reactive to light.  Neck: Normal range of motion. No JVD present. No thyromegaly present.  Cardiovascular: Normal rate, regular rhythm, normal heart sounds and intact distal pulses.  Exam reveals no gallop and no friction rub.   No murmur heard. Pulmonary/Chest: Effort normal and breath sounds normal. No respiratory distress. He has no wheezes. He has no rales. He exhibits no tenderness.  Abdominal: Soft. Bowel sounds are normal. He  exhibits no distension and no mass. There is no tenderness. There is no rebound and no guarding.  Musculoskeletal: Normal range of motion. He exhibits no edema and no tenderness.  Lymphadenopathy:    He has no cervical adenopathy.  Neurological: He is alert and oriented to person, place, and time. He has normal reflexes. No cranial nerve deficit. He exhibits normal muscle tone. Coordination normal.  Skin: Skin is warm and dry. No rash noted.  Psychiatric: He has a normal mood and affect. His behavior is normal. Judgment and thought content normal.   Lab Results  Component Value Date   WBC 8.5 05/13/2011   HGB 15.4 05/13/2011   HCT 45.0 05/13/2011   PLT 248.0 05/13/2011   GLUCOSE 87 05/13/2011   CHOL 255* 05/13/2011   TRIG 335.0* 05/13/2011   HDL 46.50 05/13/2011   LDLDIRECT 153.3 05/13/2011   ALT 50 05/13/2011   AST 27 05/13/2011   NA 141 05/13/2011   K 4.5 05/13/2011   CL 104 05/13/2011   CREATININE 1.0 05/13/2011   BUN 18 05/13/2011   CO2 29 05/13/2011   TSH 1.16 05/13/2011   PSA 1.34 05/13/2011          Assessment & Plan:

## 2013-11-06 NOTE — Assessment & Plan Note (Signed)
He has quit! 2014

## 2013-11-06 NOTE — Assessment & Plan Note (Signed)
Better  

## 2013-11-07 LAB — GC/CHLAMYDIA PROBE AMP, URINE
Chlamydia, Swab/Urine, PCR: NEGATIVE
GC PROBE AMP, URINE: NEGATIVE

## 2013-11-07 LAB — HIV ANTIBODY (ROUTINE TESTING W REFLEX): HIV: NONREACTIVE

## 2013-11-07 NOTE — Assessment & Plan Note (Signed)
Dr Allyson Sabal - reg check ups q 1-2 years We are watching a mole cluster in th LLQ of abdomen

## 2014-04-30 ENCOUNTER — Other Ambulatory Visit: Payer: Self-pay | Admitting: Internal Medicine

## 2014-05-05 ENCOUNTER — Telehealth: Payer: Self-pay | Admitting: Internal Medicine

## 2014-05-05 NOTE — Telephone Encounter (Signed)
error 

## 2014-05-27 ENCOUNTER — Other Ambulatory Visit: Payer: Self-pay | Admitting: Internal Medicine

## 2014-06-02 NOTE — Telephone Encounter (Signed)
Notified [facility] spoke with [facility]/pharmacist gave md approval for zolpidem...Stephen Blankenship

## 2014-06-07 ENCOUNTER — Other Ambulatory Visit: Payer: Self-pay | Admitting: Internal Medicine

## 2014-08-18 ENCOUNTER — Ambulatory Visit: Payer: Self-pay | Admitting: Internal Medicine

## 2014-12-15 ENCOUNTER — Other Ambulatory Visit: Payer: Self-pay | Admitting: Internal Medicine

## 2014-12-16 NOTE — Telephone Encounter (Signed)
Called pharmacy had to leave refill msg on pharmacist vm...Johny Chess

## 2015-03-13 ENCOUNTER — Telehealth: Payer: Self-pay | Admitting: *Deleted

## 2015-03-13 MED ORDER — TADALAFIL 20 MG PO TABS: ORAL_TABLET | ORAL | Status: DC

## 2015-03-13 NOTE — Telephone Encounter (Signed)
Left msg on triage requesting refill on his cialis...Stephen Blankenship

## 2015-04-30 ENCOUNTER — Telehealth: Payer: Self-pay | Admitting: Internal Medicine

## 2015-04-30 NOTE — Telephone Encounter (Signed)
Pt called in said that he would like to have more then 5 pills a month of the  Cialis.  He doesn't care if he has to pay out of pocket for them. He said that he would like 8 or 10 at very least

## 2015-04-30 NOTE — Telephone Encounter (Signed)
Ok #12 w/6 ref Corrin Parker

## 2015-05-01 MED ORDER — TADALAFIL 20 MG PO TABS: ORAL_TABLET | ORAL | Status: DC

## 2015-05-01 NOTE — Telephone Encounter (Signed)
Notified pt md ok #12 has been [facility] to [facility]. Pt states he will check his schedule & call back to make CPX

## 2015-06-01 ENCOUNTER — Telehealth: Payer: Self-pay

## 2015-06-01 NOTE — Telephone Encounter (Signed)
Pt requesting ZOLPIDEM TARTATE 10 MG Pt last Visit: 05/06/15 Last Rx refill 12/16/14 #90 with no refills     Please advise

## 2015-06-01 NOTE — Telephone Encounter (Signed)
OK to fill this prescription with additional refills x2 Pls sch well exam Thank you!

## 2015-06-02 ENCOUNTER — Telehealth: Payer: Self-pay

## 2015-06-02 ENCOUNTER — Other Ambulatory Visit: Payer: Self-pay

## 2015-06-02 MED ORDER — ZOLPIDEM TARTRATE 10 MG PO TABS: 10.0000 mg | ORAL_TABLET | Freq: Every evening | ORAL | Status: DC | PRN

## 2015-06-02 NOTE — Telephone Encounter (Signed)
I did. Thx.

## 2015-06-02 NOTE — Telephone Encounter (Signed)
rx printed for PCP

## 2015-06-02 NOTE — Telephone Encounter (Signed)
rf rq to [facility] [place] for Zolpidem 10 mg.   Okay to rf?

## 2015-06-08 ENCOUNTER — Telehealth: Payer: Self-pay | Admitting: Internal Medicine

## 2015-06-08 DIAGNOSIS — R7989 Other specified abnormal findings of blood chemistry: Secondary | ICD-10-CM

## 2015-06-08 DIAGNOSIS — Z0000 Encounter for general adult medical examination without abnormal findings: Secondary | ICD-10-CM

## 2015-06-08 NOTE — Telephone Encounter (Signed)
Pt called to schedule his appointment and he's wondering if he should have lab work done ahead of time. Please advise

## 2015-06-09 NOTE — Telephone Encounter (Signed)
Ok CMET, CBC, Lipids, TSH, PSA, UA, testosterone Dx well exam; low testost Thx

## 2015-06-10 NOTE — Telephone Encounter (Signed)
Tried to call pt but number is disconnected.   Sending my chart message.

## 2015-06-10 NOTE — Telephone Encounter (Signed)
Labs entered.

## 2015-06-12 ENCOUNTER — Ambulatory Visit (INDEPENDENT_AMBULATORY_CARE_PROVIDER_SITE_OTHER): Payer: 59 | Admitting: Internal Medicine

## 2015-06-12 ENCOUNTER — Encounter: Payer: Self-pay | Admitting: Internal Medicine

## 2015-06-12 ENCOUNTER — Other Ambulatory Visit (INDEPENDENT_AMBULATORY_CARE_PROVIDER_SITE_OTHER): Payer: 59

## 2015-06-12 VITALS — BP 100/60 | HR 104 | Temp 100.2°F | Wt 222.0 lb

## 2015-06-12 DIAGNOSIS — R7989 Other specified abnormal findings of blood chemistry: Secondary | ICD-10-CM

## 2015-06-12 DIAGNOSIS — E785 Hyperlipidemia, unspecified: Secondary | ICD-10-CM

## 2015-06-12 DIAGNOSIS — J029 Acute pharyngitis, unspecified: Secondary | ICD-10-CM | POA: Insufficient documentation

## 2015-06-12 DIAGNOSIS — J020 Streptococcal pharyngitis: Secondary | ICD-10-CM

## 2015-06-12 DIAGNOSIS — E291 Testicular hypofunction: Secondary | ICD-10-CM | POA: Diagnosis not present

## 2015-06-12 DIAGNOSIS — Z0000 Encounter for general adult medical examination without abnormal findings: Secondary | ICD-10-CM

## 2015-06-12 LAB — COMPREHENSIVE METABOLIC PANEL
ALBUMIN: 4.3 g/dL (ref 3.5–5.2)
ALK PHOS: 78 U/L (ref 39–117)
ALT: 26 U/L (ref 0–53)
AST: 15 U/L (ref 0–37)
BILIRUBIN TOTAL: 0.6 mg/dL (ref 0.2–1.2)
BUN: 14 mg/dL (ref 6–23)
CALCIUM: 9.4 mg/dL (ref 8.4–10.5)
CO2: 26 mEq/L (ref 19–32)
Chloride: 101 mEq/L (ref 96–112)
Creatinine, Ser: 0.98 mg/dL (ref 0.40–1.50)
GFR: 87.66 mL/min (ref >60.00)
Glucose, Bld: 133 mg/dL — ABNORMAL HIGH (ref 70–99)
Potassium: 3.6 mEq/L (ref 3.5–5.1)
Sodium: 138 mEq/L (ref 135–145)
TOTAL PROTEIN: 7.8 g/dL (ref 6.0–8.3)

## 2015-06-12 LAB — URINALYSIS, ROUTINE W REFLEX MICROSCOPIC
HGB URINE DIPSTICK: NEGATIVE
Ketones, ur: NEGATIVE
Leukocytes, UA: NEGATIVE
Nitrite: NEGATIVE
Specific Gravity, Urine: >=1.03 — AB (ref 1.000–1.030)
TOTAL PROTEIN, URINE-UPE24: 30 — AB
URINE GLUCOSE: NEGATIVE
UROBILINOGEN UA: 0.2 (ref 0.0–1.0)
pH: 5.5 (ref 5.0–8.0)

## 2015-06-12 LAB — CBC WITH DIFFERENTIAL/PLATELET
BASOS ABS: 0.1 10*3/uL (ref 0.0–0.1)
Basophils Relative: 0.4 % (ref 0.0–3.0)
EOS ABS: 0.2 10*3/uL (ref 0.0–0.7)
Eosinophils Relative: 0.8 % (ref 0.0–5.0)
HEMATOCRIT: 43.4 % (ref 39.0–52.0)
Hemoglobin: 14.9 g/dL (ref 13.0–17.0)
LYMPHS PCT: 7.2 % — AB (ref 12.0–46.0)
Lymphs Abs: 1.5 10*3/uL (ref 0.7–4.0)
MCHC: 34.2 g/dL (ref 30.0–36.0)
MCV: 86.4 fl (ref 78.0–100.0)
Monocytes Absolute: 2 10*3/uL — ABNORMAL HIGH (ref 0.1–1.0)
Monocytes Relative: 9.2 % (ref 3.0–12.0)
NEUTROS ABS: 17.7 10*3/uL — AB (ref 1.4–7.7)
NEUTROS PCT: 82.4 % — AB (ref 43.0–77.0)
PLATELETS: 233 10*3/uL (ref 150.0–400.0)
RBC: 5.03 Mil/uL (ref 4.22–5.81)
RDW: 12.6 % (ref 11.5–15.5)
WBC: 21.5 10*3/uL (ref 4.0–10.5)

## 2015-06-12 LAB — LIPID PANEL
CHOL/HDL RATIO: 5
CHOLESTEROL: 239 mg/dL — AB (ref 0–200)
HDL: 52.2 mg/dL (ref >39.00)
LDL Cholesterol: 154 mg/dL — ABNORMAL HIGH (ref 0–99)
NonHDL: 186.8
TRIGLYCERIDES: 163 mg/dL — AB (ref 0.0–149.0)
VLDL: 32.6 mg/dL (ref 0.0–40.0)

## 2015-06-12 LAB — TSH: TSH: 1.78 u[IU]/mL (ref 0.35–4.50)

## 2015-06-12 LAB — TESTOSTERONE: Testosterone: 148.55 ng/dL — ABNORMAL LOW (ref 300.00–890.00)

## 2015-06-12 LAB — POCT RAPID STREP A (OFFICE): Rapid Strep A Screen: POSITIVE — AB

## 2015-06-12 MED ORDER — PSEUDOEPHEDRINE HCL 30 MG PO TABS: 30.0000 mg | ORAL_TABLET | ORAL | Status: DC | PRN

## 2015-06-12 MED ORDER — KETOROLAC TROMETHAMINE 60 MG/2ML IM SOLN
60.0000 mg | Freq: Once | INTRAMUSCULAR | Status: AC
  Administered 2015-06-12: 60 mg via INTRAMUSCULAR

## 2015-06-12 MED ORDER — VITAMIN D 1000 UNITS PO TABS: 1000.0000 [IU] | ORAL_TABLET | Freq: Every day | ORAL | Status: AC

## 2015-06-12 MED ORDER — CEFTRIAXONE SODIUM 1 G IJ SOLR
1.0000 g | Freq: Once | INTRAMUSCULAR | Status: AC
  Administered 2015-06-12: 1 g via INTRAMUSCULAR

## 2015-06-12 MED ORDER — TADALAFIL 20 MG PO TABS: ORAL_TABLET | ORAL | Status: DC

## 2015-06-12 MED ORDER — TRAMADOL HCL 50 MG PO TABS: 50.0000 mg | ORAL_TABLET | Freq: Four times a day (QID) | ORAL | Status: DC | PRN

## 2015-06-12 MED ORDER — AZITHROMYCIN 250 MG PO TABS: ORAL_TABLET | ORAL | Status: DC

## 2015-06-12 NOTE — Progress Notes (Signed)
Pre visit review using our clinic review tool, if applicable. No additional management support is needed unless otherwise documented below in the visit note. 

## 2015-06-12 NOTE — Progress Notes (Signed)
Subjective:  Patient ID: Stephen Blankenship, male    DOB: 1970-12-29  Age: 44 y.o. MRN: [id number]  CC: No chief complaint on file.   HPI Stephen Blankenship presents for sever ST x days, unable to swallow, fever  Outpatient Prescriptions Prior to Visit  Medication Sig Dispense Refill  . zolpidem (AMBIEN) 10 MG tablet Take 1 tablet (10 mg total) by mouth at bedtime as needed. for sleep 90 tablet 0  . tadalafil (CIALIS) 20 MG tablet TAKE 1 TABLET BY MOUTH DAILY AS NEEDED FOR ERECTILE DISFUNCTION 12 tablet 2  . albuterol (PROVENTIL HFA;VENTOLIN HFA) 108 (90 BASE) MCG/ACT inhaler Inhale 2 puffs into the lungs 4 (four) times daily. (Patient not taking: Reported on 06/12/2015) 1 Inhaler 3  . budesonide-formoterol (SYMBICORT) 160-4.5 MCG/ACT inhaler Inhale 2 puffs into the lungs 2 (two) times daily.      Marland Kitchen buPROPion (WELLBUTRIN XL) 150 MG 24 hr tablet Take 1 tablet (150 mg total) by mouth daily. (Patient not taking: Reported on 06/12/2015) 30 tablet 5  . clobetasol ointment (TEMOVATE) 0.05 %     . EPINEPHrine (EPIPEN 2-PAK) 0.3 mg/0.3 mL DEVI Inject 0.3 mLs (0.3 mg total) into the muscle once. (Patient not taking: Reported on 06/12/2015) 1 Device 3  . omega-3 acid ethyl esters (LOVAZA) 1 G capsule Take 2 capsules (2 g total) by mouth 2 (two) times daily. 120 capsule 11   No facility-administered medications prior to visit.    ROS Review of Systems  Constitutional: Positive for fever and fatigue. Negative for appetite change and unexpected weight change.  HENT: Positive for congestion, sore throat, trouble swallowing and voice change. Negative for nosebleeds and sneezing.   Eyes: Negative for itching and visual disturbance.  Respiratory: Negative for cough.   Cardiovascular: Negative for chest pain, palpitations and leg swelling.  Gastrointestinal: Negative for nausea, diarrhea, blood in stool and abdominal distention.  Genitourinary: Negative for frequency and hematuria.  Musculoskeletal: Negative for  back pain, joint swelling, gait problem and neck pain.  Skin: Negative for rash.  Neurological: Negative for dizziness, tremors, speech difficulty and weakness.  Psychiatric/Behavioral: Negative for sleep disturbance, dysphoric mood and agitation. The patient is not nervous/anxious.     Objective:  BP 100/60 mmHg  Pulse 104  Temp(Src) 100.2 F (37.9 C) (Oral)  Wt 222 lb (100.699 kg)  SpO2 95%  BP Readings from Last 3 Encounters:  06/12/15 100/60  11/06/13 130/74  07/09/13 110/72    Wt Readings from Last 3 Encounters:  06/12/15 222 lb (100.699 kg)  11/06/13 196 lb (88.905 kg)  07/09/13 204 lb (92.534 kg)    Physical Exam  Constitutional: He is oriented to person, place, and time. He appears well-developed. No distress.  NAD  HENT:  Mouth/Throat: Oropharyngeal exudate present.  Eyes: Conjunctivae are normal. Pupils are equal, round, and reactive to light. No scleral icterus.  Neck: Normal range of motion. Neck supple. No JVD present. No tracheal deviation present. No thyromegaly present.  Cardiovascular: Normal rate, regular rhythm, normal heart sounds and intact distal pulses.  Exam reveals no gallop and no friction rub.   No murmur heard. Pulmonary/Chest: Effort normal and breath sounds normal. No respiratory distress. He has no wheezes. He has no rales. He exhibits no tenderness.  Abdominal: Soft. Bowel sounds are normal. He exhibits no distension and no mass. There is no tenderness. There is no rebound and no guarding.  Musculoskeletal: Normal range of motion. He exhibits no edema or tenderness.  Lymphadenopathy:  He has cervical adenopathy.  Neurological: He is alert and oriented to person, place, and time. He has normal reflexes. No cranial nerve deficit. He exhibits normal muscle tone. He displays a negative Romberg sign. Coordination and gait normal.  Skin: Skin is warm and dry. No rash noted.  Psychiatric: He has a normal mood and affect. His behavior is normal.  Judgment and thought content normal.  eryth swollen throat  Lab Results  Component Value Date   WBC 21.5 Repeated and verified X2.* 06/12/2015   HGB 14.9 06/12/2015   HCT 43.4 06/12/2015   PLT 233.0 06/12/2015   GLUCOSE 133* 06/12/2015   CHOL 239* 06/12/2015   TRIG 163.0* 06/12/2015   HDL 52.20 06/12/2015   LDLDIRECT 153.3 05/13/2011   LDLCALC 154* 06/12/2015   ALT 26 06/12/2015   AST 15 06/12/2015   NA 138 06/12/2015   K 3.6 06/12/2015   CL 101 06/12/2015   CREATININE 0.98 06/12/2015   BUN 14 06/12/2015   CO2 26 06/12/2015   TSH 1.78 06/12/2015   PSA 1.31 06/12/2015    Dg Chest 2 View  06/08/2010  Clinical Data: Cough and dyspnea  CHEST - 2 VIEW  Comparison: October 05, 2003 and January 03, 2005  Findings: The cardiac silhouette, mediastinum, pulmonary vasculature are within normal limits.  Both lungs are clear, except for plate-like atelectasis/scarring at both lung bases, left greater than right which is similar to the prior study.   There is no acute bony abnormality.  IMPRESSION: Stable chest x-ray with bibasilar atelectasis / scarring. Provider: Lorina Rabon, Lelon Huh   Assessment & Plan:   Diagnoses and all orders for this visit:  Acute streptococcal pharyngitis -     cefTRIAXone (ROCEPHIN) injection 1 g; Inject 1 g into the muscle once. -     ketorolac (TORADOL) injection 60 mg; Inject 2 mLs (60 mg total) into the muscle once.  Streptococcal sore throat -     POCT rapid strep A  Other orders -     traMADol (ULTRAM) 50 MG tablet; Take 1-2 tablets (50-100 mg total) by mouth every 6 (six) hours as needed. -     azithromycin (ZITHROMAX) 250 MG tablet; As directed -     pseudoephedrine (SUDAFED) 30 MG tablet; Take 1-2 tablets (30-60 mg total) by mouth every 4 (four) hours as needed for congestion. -     tadalafil (CIALIS) 20 MG tablet; TAKE 1 TABLET BY MOUTH DAILY AS NEEDED FOR ERECTILE DISFUNCTION -     cholecalciferol (VITAMIN D) 1000 UNITS tablet; Take 1  tablet (1,000 Units total) by mouth daily.  I am having [name] start on traMADol, azithromycin, pseudoephedrine, and cholecalciferol. I am also having him maintain his budesonide-formoterol, omega-3 acid ethyl esters, albuterol, EPINEPHrine, clobetasol ointment, buPROPion, zolpidem, and tadalafil. We administered cefTRIAXone and ketorolac.  Meds ordered this encounter  Medications  . traMADol (ULTRAM) 50 MG tablet    Sig: Take 1-2 tablets (50-100 mg total) by mouth every 6 (six) hours as needed.    Dispense:  40 tablet    Refill:  0  . azithromycin (ZITHROMAX) 250 MG tablet    Sig: As directed    Dispense:  6 tablet    Refill:  0  . pseudoephedrine (SUDAFED) 30 MG tablet    Sig: Take 1-2 tablets (30-60 mg total) by mouth every 4 (four) hours as needed for congestion.    Dispense:  60 tablet    Refill:  1  . tadalafil (CIALIS) 20  MG tablet    Sig: TAKE 1 TABLET BY MOUTH DAILY AS NEEDED FOR ERECTILE DISFUNCTION    Dispense:  30 tablet    Refill:  6  . cholecalciferol (VITAMIN D) 1000 UNITS tablet    Sig: Take 1 tablet (1,000 Units total) by mouth daily.    Dispense:  100 tablet    Refill:  3  . cefTRIAXone (ROCEPHIN) injection 1 g    Sig:   . ketorolac (TORADOL) injection 60 mg    Sig:      Follow-up: No Follow-up on file.  Walker Kehr, MD

## 2015-06-13 LAB — PSA, TOTAL AND FREE
PSA, Free Pct: 23 % — ABNORMAL LOW (ref >25)
PSA, Free: 0.3 ng/mL
PSA: 1.31 ng/mL (ref <=4.00)

## 2015-06-14 ENCOUNTER — Encounter: Payer: Self-pay | Admitting: Internal Medicine

## 2015-06-14 NOTE — Assessment & Plan Note (Signed)
Labs

## 2015-06-14 NOTE — Assessment & Plan Note (Signed)
Strep (+) - severe Rocephin IM Zpac Tramadol prn Sudafed prn

## 2015-08-13 ENCOUNTER — Telehealth: Payer: Self-pay | Admitting: *Deleted

## 2015-08-13 MED ORDER — SILDENAFIL CITRATE 100 MG PO TABS: 100.0000 mg | ORAL_TABLET | ORAL | Status: DC | PRN

## 2015-08-13 NOTE — Telephone Encounter (Signed)
Left msg on triage stating md has rx Cialis for him, but he is wanting to try another med "Viagra" if ok pls [facility] [place]...Stephen Blankenship

## 2015-08-13 NOTE — Telephone Encounter (Signed)
Ok done Thx 

## 2015-08-14 NOTE — Telephone Encounter (Signed)
Notified pt md ok [facility]...Stephen Blankenship

## 2015-10-08 ENCOUNTER — Telehealth: Payer: Self-pay | Admitting: Internal Medicine

## 2015-10-08 DIAGNOSIS — E785 Hyperlipidemia, unspecified: Secondary | ICD-10-CM

## 2015-10-08 DIAGNOSIS — Z7721 Contact with and (suspected) exposure to potentially hazardous body fluids: Secondary | ICD-10-CM

## 2015-10-08 DIAGNOSIS — E291 Testicular hypofunction: Secondary | ICD-10-CM

## 2015-10-08 NOTE — Telephone Encounter (Signed)
Patient states Dr. Alain Marion was wanting him to redo his last labs.  Patient wants to come in on Monday to have done.  Please follow back up with patient.

## 2015-10-08 NOTE — Telephone Encounter (Signed)
Check CBC, free testosterone. Sch OV for a well exam Thx

## 2015-10-08 NOTE — Telephone Encounter (Signed)
Notified pt lab been entered...Stephen Blankenship

## 2015-10-12 ENCOUNTER — Other Ambulatory Visit (INDEPENDENT_AMBULATORY_CARE_PROVIDER_SITE_OTHER): Payer: 59

## 2015-10-12 DIAGNOSIS — E785 Hyperlipidemia, unspecified: Secondary | ICD-10-CM | POA: Diagnosis not present

## 2015-10-12 DIAGNOSIS — E291 Testicular hypofunction: Secondary | ICD-10-CM

## 2015-10-12 DIAGNOSIS — Z7721 Contact with and (suspected) exposure to potentially hazardous body fluids: Secondary | ICD-10-CM

## 2015-10-12 LAB — CBC WITH DIFFERENTIAL/PLATELET
BASOS ABS: 0 10*3/uL (ref 0.0–0.1)
Basophils Relative: 0.3 % (ref 0.0–3.0)
Eosinophils Absolute: 0.2 10*3/uL (ref 0.0–0.7)
Eosinophils Relative: 2.7 % (ref 0.0–5.0)
HCT: 43 % (ref 39.0–52.0)
Hemoglobin: 14.7 g/dL (ref 13.0–17.0)
LYMPHS ABS: 2.1 10*3/uL (ref 0.7–4.0)
Lymphocytes Relative: 28 % (ref 12.0–46.0)
MCHC: 34.2 g/dL (ref 30.0–36.0)
MCV: 85.9 fl (ref 78.0–100.0)
MONOS PCT: 10.9 % (ref 3.0–12.0)
Monocytes Absolute: 0.8 10*3/uL (ref 0.1–1.0)
NEUTROS PCT: 58.1 % (ref 43.0–77.0)
Neutro Abs: 4.4 10*3/uL (ref 1.4–7.7)
Platelets: 226 10*3/uL (ref 150.0–400.0)
RBC: 5 Mil/uL (ref 4.22–5.81)
RDW: 13 % (ref 11.5–15.5)
WBC: 7.5 10*3/uL (ref 4.0–10.5)

## 2015-10-12 NOTE — Telephone Encounter (Signed)
Pt in [facility] lab today. He is requesting HIV and GC/Chlamydia testing be added to his labs PCP ordered. Lab order placed.

## 2015-10-12 NOTE — Addendum Note (Signed)
Addended by: Cresenciano Lick on: 10/12/2015 08:31 AM   Modules accepted: Orders

## 2015-10-13 ENCOUNTER — Ambulatory Visit (INDEPENDENT_AMBULATORY_CARE_PROVIDER_SITE_OTHER): Payer: 59 | Admitting: Internal Medicine

## 2015-10-13 ENCOUNTER — Encounter: Payer: Self-pay | Admitting: Internal Medicine

## 2015-10-13 VITALS — BP 122/80 | HR 96 | Temp 98.6°F | Wt 226.0 lb

## 2015-10-13 DIAGNOSIS — N529 Male erectile dysfunction, unspecified: Secondary | ICD-10-CM | POA: Diagnosis not present

## 2015-10-13 DIAGNOSIS — R05 Cough: Secondary | ICD-10-CM

## 2015-10-13 DIAGNOSIS — J4520 Mild intermittent asthma, uncomplicated: Secondary | ICD-10-CM

## 2015-10-13 DIAGNOSIS — G4700 Insomnia, unspecified: Secondary | ICD-10-CM | POA: Diagnosis not present

## 2015-10-13 DIAGNOSIS — R059 Cough, unspecified: Secondary | ICD-10-CM

## 2015-10-13 LAB — TESTOSTERONE TOTAL,FREE,BIO, MALES
ALBUMIN: 4.4 g/dL (ref 3.6–5.1)
Sex Hormone Binding: 21 nmol/L (ref 10–50)
TESTOSTERONE BIOAVAILABLE: 107.2 ng/dL — AB (ref 130.5–681.7)
Testosterone, Free: 53.3 pg/mL (ref 47.0–244.0)
Testosterone: 296 ng/dL (ref 250–827)

## 2015-10-13 LAB — GC/CHLAMYDIA PROBE AMP
CT PROBE, AMP APTIMA: NOT DETECTED
GC Probe RNA: NOT DETECTED

## 2015-10-13 LAB — HIV ANTIBODY (ROUTINE TESTING W REFLEX): HIV: NONREACTIVE

## 2015-10-13 MED ORDER — PROMETHAZINE-CODEINE 6.25-10 MG/5ML PO SYRP: 5.0000 mL | ORAL_SOLUTION | ORAL | Status: DC | PRN

## 2015-10-13 MED ORDER — FLUTICASONE FUROATE-VILANTEROL 100-25 MCG/INH IN AEPB: 1.0000 | INHALATION_SPRAY | Freq: Every day | RESPIRATORY_TRACT | Status: DC

## 2015-10-13 MED ORDER — LORATADINE 10 MG PO TABS: 10.0000 mg | ORAL_TABLET | Freq: Every day | ORAL | Status: DC

## 2015-10-13 MED ORDER — CLOBETASOL PROPIONATE 0.05 % EX OINT: TOPICAL_OINTMENT | Freq: Two times a day (BID) | CUTANEOUS | Status: DC | PRN

## 2015-10-13 NOTE — Assessment & Plan Note (Signed)
Breo Prom-cod CXR

## 2015-10-13 NOTE — Assessment & Plan Note (Signed)
Zolpidem prn  Potential benefits of a long term benzodiazepines  use as well as potential risks  and complications were explained to the patient and were aknowledged. 

## 2015-10-13 NOTE — Progress Notes (Signed)
Pre visit review using our clinic review tool, if applicable. No additional management support is needed unless otherwise documented below in the visit note. 

## 2015-10-13 NOTE — Assessment & Plan Note (Signed)
Cialis prn 

## 2015-10-13 NOTE — Progress Notes (Signed)
Subjective:  Patient ID: Stephen Blankenship, male    DOB: 10/04/1970  Age: 45 y.o. MRN: [id number]  CC: Cough   HPI [name] presents for a cough x 7 weeks, elevated WBC. C/o rash on B legs x3 d - itchy. F/u ED, insomnia Vaping some  Outpatient Prescriptions Prior to Visit  Medication Sig Dispense Refill  . cholecalciferol (VITAMIN D) 1000 UNITS tablet Take 1 tablet (1,000 Units total) by mouth daily. 100 tablet 3  . zolpidem (AMBIEN) 10 MG tablet Take 1 tablet (10 mg total) by mouth at bedtime as needed. for sleep 90 tablet 0  . albuterol (PROVENTIL HFA;VENTOLIN HFA) 108 (90 BASE) MCG/ACT inhaler Inhale 2 puffs into the lungs 4 (four) times daily. 1 Inhaler 3  . clobetasol ointment (TEMOVATE) 0.05 %     . sildenafil (VIAGRA) 100 MG tablet Take 1 tablet (100 mg total) by mouth as needed for erectile dysfunction. 12 tablet 11  . buPROPion (WELLBUTRIN XL) 150 MG 24 hr tablet Take 1 tablet (150 mg total) by mouth daily. (Patient not taking: Reported on 10/13/2015) 30 tablet 5  . EPINEPHrine (EPIPEN 2-PAK) 0.3 mg/0.3 mL DEVI Inject 0.3 mLs (0.3 mg total) into the muscle once. (Patient not taking: Reported on 10/13/2015) 1 Device 3  . omega-3 acid ethyl esters (LOVAZA) 1 G capsule Take 2 capsules (2 g total) by mouth 2 (two) times daily. 120 capsule 11  . pseudoephedrine (SUDAFED) 30 MG tablet Take 1-2 tablets (30-60 mg total) by mouth every 4 (four) hours as needed for congestion. (Patient not taking: Reported on 10/13/2015) 60 tablet 1  . traMADol (ULTRAM) 50 MG tablet Take 1-2 tablets (50-100 mg total) by mouth every 6 (six) hours as needed. (Patient not taking: Reported on 10/13/2015) 40 tablet 0  . budesonide-formoterol (SYMBICORT) 160-4.5 MCG/ACT inhaler Inhale 2 puffs into the lungs 2 (two) times daily. Reported on 10/13/2015     No facility-administered medications prior to visit.    ROS Review of Systems  Constitutional: Negative for appetite change, fatigue and unexpected weight change.    HENT: Negative for congestion, nosebleeds, sneezing, sore throat and trouble swallowing.   Eyes: Negative for itching and visual disturbance.  Respiratory: Positive for cough.   Cardiovascular: Negative for chest pain, palpitations and leg swelling.  Gastrointestinal: Negative for nausea, diarrhea, blood in stool and abdominal distention.  Genitourinary: Negative for frequency and hematuria.  Musculoskeletal: Negative for back pain, joint swelling, gait problem and neck pain.  Skin: Positive for rash.  Neurological: Negative for dizziness, tremors, speech difficulty and weakness.  Psychiatric/Behavioral: Negative for sleep disturbance, dysphoric mood and agitation. The patient is not nervous/anxious.     Objective:  BP 122/80 mmHg  Pulse 96  Temp(Src) 98.6 F (37 C) (Oral)  Wt 226 lb (102.513 kg)  SpO2 96%  BP Readings from Last 3 Encounters:  10/13/15 122/80  06/12/15 100/60  11/06/13 130/74    Wt Readings from Last 3 Encounters:  10/13/15 226 lb (102.513 kg)  06/12/15 222 lb (100.699 kg)  11/06/13 196 lb (88.905 kg)    Physical Exam  Constitutional: He is oriented to person, place, and time. He appears well-developed. No distress.  NAD  HENT:  Mouth/Throat: Oropharynx is clear and moist.  Eyes: Conjunctivae are normal. Pupils are equal, round, and reactive to light.  Neck: Normal range of motion. No JVD present. No thyromegaly present.  Cardiovascular: Normal rate, regular rhythm, normal heart sounds and intact distal pulses.  Exam reveals no  gallop and no friction rub.   No murmur heard. Pulmonary/Chest: Effort normal and breath sounds normal. No respiratory distress. He has no wheezes. He has no rales. He exhibits no tenderness.  Abdominal: Soft. Bowel sounds are normal. He exhibits no distension and no mass. There is no tenderness. There is no rebound and no guarding.  Musculoskeletal: Normal range of motion. He exhibits no edema or tenderness.  Lymphadenopathy:     He has no cervical adenopathy.  Neurological: He is alert and oriented to person, place, and time. He has normal reflexes. No cranial nerve deficit. He exhibits normal muscle tone. He displays a negative Romberg sign. Coordination and gait normal.  Skin: Skin is warm and dry. Rash noted.  Psychiatric: He has a normal mood and affect. His behavior is normal. Judgment and thought content normal.  3 mm eryth rash on LEs below knees   I personally provided Breo inhaler use teaching. After the teaching patient was able to demonstrate it's use effectively. All questions were answered  Lab Results  Component Value Date   WBC 7.5 10/12/2015   HGB 14.7 10/12/2015   HCT 43.0 10/12/2015   PLT 226.0 10/12/2015   GLUCOSE 133* 06/12/2015   CHOL 239* 06/12/2015   TRIG 163.0* 06/12/2015   HDL 52.20 06/12/2015   LDLDIRECT 153.3 05/13/2011   LDLCALC 154* 06/12/2015   ALT 26 06/12/2015   AST 15 06/12/2015   NA 138 06/12/2015   K 3.6 06/12/2015   CL 101 06/12/2015   CREATININE 0.98 06/12/2015   BUN 14 06/12/2015   CO2 26 06/12/2015   TSH 1.78 06/12/2015   PSA 1.31 06/12/2015    Dg Chest 2 View  06/08/2010  Clinical Data: Cough and dyspnea  CHEST - 2 VIEW  Comparison: October 05, 2003 and January 03, 2005  Findings: The cardiac silhouette, mediastinum, pulmonary vasculature are within normal limits.  Both lungs are clear, except for plate-like atelectasis/scarring at both lung bases, left greater than right which is similar to the prior study.   There is no acute bony abnormality.  IMPRESSION: Stable chest x-ray with bibasilar atelectasis / scarring. Provider: Lorina Rabon, Lelon Huh   Assessment & Plan:   Rodrickus was seen today for cough.  Diagnoses and all orders for this visit:  INSOMNIA, CHRONIC  Erectile dysfunction, unspecified erectile dysfunction type  Cough -     DG Chest 2 View  Asthmatic bronchitis, mild intermittent, uncomplicated  Other orders -     clobetasol ointment  (TEMOVATE) 0.05 %; Apply topically 2 (two) times daily as needed. -     loratadine (CLARITIN) 10 MG tablet; Take 1 tablet (10 mg total) by mouth daily. -     fluticasone furoate-vilanterol (BREO ELLIPTA) 100-25 MCG/INH AEPB; Inhale 1 puff into the lungs daily. -     promethazine-codeine (PHENERGAN WITH CODEINE) 6.25-10 MG/5ML syrup; Take 5 mLs by mouth every 4 (four) hours as needed.  I have discontinued Mr. Delamarter budesonide-formoterol, albuterol, and sildenafil. I have also changed his clobetasol ointment. Additionally, I am having him start on loratadine, fluticasone furoate-vilanterol, and promethazine-codeine. Lastly, I am having him maintain his omega-3 acid ethyl esters, EPINEPHrine, buPROPion, zolpidem, traMADol, pseudoephedrine, and cholecalciferol.  Meds ordered this encounter  Medications  . clobetasol ointment (TEMOVATE) 0.05 %    Sig: Apply topically 2 (two) times daily as needed.    Dispense:  60 g    Refill:  3  . loratadine (CLARITIN) 10 MG tablet    Sig: Take 1  tablet (10 mg total) by mouth daily.    Dispense:  100 tablet    Refill:  3  . fluticasone furoate-vilanterol (BREO ELLIPTA) 100-25 MCG/INH AEPB    Sig: Inhale 1 puff into the lungs daily.    Dispense:  1 each    Refill:  5  . promethazine-codeine (PHENERGAN WITH CODEINE) 6.25-10 MG/5ML syrup    Sig: Take 5 mLs by mouth every 4 (four) hours as needed.    Dispense:  300 mL    Refill:  0     Follow-up: Return in about 6 months (around 04/13/2016) for Wellness Exam.  Walker Kehr, MD

## 2015-10-13 NOTE — Assessment & Plan Note (Signed)
Asthma related Prom-cod

## 2015-11-16 ENCOUNTER — Telehealth: Payer: Self-pay

## 2015-11-16 MED ORDER — BENZONATATE 200 MG PO CAPS: 200.0000 mg | ORAL_CAPSULE | Freq: Three times a day (TID) | ORAL | Status: DC | PRN

## 2015-11-16 NOTE — Telephone Encounter (Signed)
Ok X ray [facility] - emailed Thx

## 2015-11-16 NOTE — Telephone Encounter (Signed)
Pt informed

## 2015-11-16 NOTE — Telephone Encounter (Signed)
Patient called and forgot to get the xray that day he was here. States he will come in on Wednesday to do it. Also wanted to know if he could have something else for the cough. He wants something no so strong the other makes him sleepy. Please follow up or advise

## 2015-11-18 ENCOUNTER — Ambulatory Visit (INDEPENDENT_AMBULATORY_CARE_PROVIDER_SITE_OTHER)
Admission: RE | Admit: 2015-11-18 | Discharge: 2015-11-18 | Disposition: A | Discharge status: Home / Self Care | Payer: 59 | Source: Ambulatory Visit | Attending: Internal Medicine | Admitting: Internal Medicine

## 2015-11-18 DIAGNOSIS — R05 Cough: Secondary | ICD-10-CM

## 2015-12-29 ENCOUNTER — Telehealth: Payer: Self-pay | Admitting: Internal Medicine

## 2015-12-29 NOTE — Telephone Encounter (Signed)
OK to fill this prescription with additional refills x2 Thank you!  

## 2015-12-29 NOTE — Telephone Encounter (Signed)
Pt request refill for zolpidem (AMBIEN) 10 MG tablet [facility]. Please help

## 2015-12-30 ENCOUNTER — Other Ambulatory Visit: Payer: Self-pay | Admitting: Internal Medicine

## 2015-12-30 MED ORDER — ZOLPIDEM TARTRATE 10 MG PO TABS: 10.0000 mg | ORAL_TABLET | Freq: Every evening | ORAL | Status: DC | PRN

## 2015-12-30 NOTE — Telephone Encounter (Signed)
rx called in

## 2016-01-01 NOTE — Telephone Encounter (Signed)
[facility],....Johny Chess

## 2016-06-22 ENCOUNTER — Other Ambulatory Visit: Payer: Self-pay | Admitting: Internal Medicine

## 2016-06-22 NOTE — Telephone Encounter (Signed)
sch ov 

## 2016-06-23 NOTE — Telephone Encounter (Signed)
Done. Will have scheduler contact pt to make OV with PCP.

## 2016-06-29 ENCOUNTER — Telehealth: Payer: Self-pay | Admitting: *Deleted

## 2016-06-29 MED ORDER — ALBUTEROL SULFATE HFA 108 (90 BASE) MCG/ACT IN AERS: 2.0000 | INHALATION_SPRAY | Freq: Four times a day (QID) | RESPIRATORY_TRACT | 1 refills | Status: DC | PRN

## 2016-06-29 NOTE — Telephone Encounter (Signed)
Ok done Sch OV Thx

## 2016-06-29 NOTE — Telephone Encounter (Signed)
Rec'd fax pt requesting a new rx for a Ventolin inhaler. Not on med list pls advise...Stephen Blankenship

## 2016-07-15 ENCOUNTER — Encounter: Payer: Self-pay | Admitting: Internal Medicine

## 2016-07-15 ENCOUNTER — Ambulatory Visit (INDEPENDENT_AMBULATORY_CARE_PROVIDER_SITE_OTHER): Payer: 59 | Admitting: Internal Medicine

## 2016-07-15 VITALS — BP 122/86 | HR 84 | Temp 98.3°F | Wt 225.0 lb

## 2016-07-15 DIAGNOSIS — Z23 Encounter for immunization: Secondary | ICD-10-CM | POA: Diagnosis not present

## 2016-07-15 DIAGNOSIS — J181 Lobar pneumonia, unspecified organism: Secondary | ICD-10-CM | POA: Diagnosis not present

## 2016-07-15 DIAGNOSIS — J189 Pneumonia, unspecified organism: Secondary | ICD-10-CM

## 2016-07-15 DIAGNOSIS — R05 Cough: Secondary | ICD-10-CM

## 2016-07-15 DIAGNOSIS — J4520 Mild intermittent asthma, uncomplicated: Secondary | ICD-10-CM

## 2016-07-15 DIAGNOSIS — R5383 Other fatigue: Secondary | ICD-10-CM

## 2016-07-15 DIAGNOSIS — R059 Cough, unspecified: Secondary | ICD-10-CM

## 2016-07-15 DIAGNOSIS — Z0000 Encounter for general adult medical examination without abnormal findings: Secondary | ICD-10-CM

## 2016-07-15 MED ORDER — ZOLPIDEM TARTRATE 10 MG PO TABS: 10.0000 mg | ORAL_TABLET | Freq: Every evening | ORAL | 1 refills | Status: DC | PRN

## 2016-07-15 MED ORDER — TADALAFIL 20 MG PO TABS: 20.0000 mg | ORAL_TABLET | Freq: Every day | ORAL | 5 refills | Status: DC | PRN

## 2016-07-15 MED ORDER — AMOXICILLIN-POT CLAVULANATE 875-125 MG PO TABS: 1.0000 | ORAL_TABLET | Freq: Two times a day (BID) | ORAL | 0 refills | Status: DC

## 2016-07-15 MED ORDER — PROMETHAZINE-CODEINE 6.25-10 MG/5ML PO SYRP: 5.0000 mL | ORAL_SOLUTION | ORAL | 0 refills | Status: DC | PRN

## 2016-07-15 NOTE — Assessment & Plan Note (Signed)
Ventolin prn 

## 2016-07-15 NOTE — Assessment & Plan Note (Signed)
Augmentin x 10 d CXR in 3 mo

## 2016-07-15 NOTE — Progress Notes (Signed)
Subjective:  Patient ID: Stephen Blankenship, male    DOB: 21-Nov-1970  Age: 46 y.o. MRN: [id number]  CC: No chief complaint on file.   HPI Stephen Blankenship presents for R CAP over the holidays; asthma flare up. He had a strep test (+) and took Amoxicillin.  C/o cough x 3 weeks F/u insomnia  Outpatient Medications Prior to Visit  Medication Sig Dispense Refill  . albuterol (PROVENTIL HFA;VENTOLIN HFA) 108 (90 Base) MCG/ACT inhaler Inhale 2 puffs into the lungs every 6 (six) hours as needed for wheezing or shortness of breath. 1 Inhaler 1  . benzonatate (TESSALON) 200 MG capsule Take 1 capsule (200 mg total) by mouth 3 (three) times daily as needed for cough. 60 capsule 0  . clobetasol ointment (TEMOVATE) 0.05 % Apply topically 2 (two) times daily as needed. 60 g 3  . EPINEPHrine (EPIPEN 2-PAK) 0.3 mg/0.3 mL DEVI Inject 0.3 mLs (0.3 mg total) into the muscle once. 1 Device 3  . fluticasone furoate-vilanterol (BREO ELLIPTA) 100-25 MCG/INH AEPB Inhale 1 puff into the lungs daily. 1 each 5  . traMADol (ULTRAM) 50 MG tablet Take 1-2 tablets (50-100 mg total) by mouth every 6 (six) hours as needed. 40 tablet 0  . buPROPion (WELLBUTRIN XL) 150 MG 24 hr tablet Take 1 tablet (150 mg total) by mouth daily. (Patient not taking: Reported on 07/15/2016) 30 tablet 5  . loratadine (CLARITIN) 10 MG tablet Take 1 tablet (10 mg total) by mouth daily. (Patient not taking: Reported on 07/15/2016) 100 tablet 3  . omega-3 acid ethyl esters (LOVAZA) 1 G capsule Take 2 capsules (2 g total) by mouth 2 (two) times daily. 120 capsule 11  . promethazine-codeine (PHENERGAN WITH CODEINE) 6.25-10 MG/5ML syrup Take 5 mLs by mouth every 4 (four) hours as needed. (Patient not taking: Reported on 07/15/2016) 300 mL 0  . pseudoephedrine (SUDAFED) 30 MG tablet Take 1-2 tablets (30-60 mg total) by mouth every 4 (four) hours as needed for congestion. (Patient not taking: Reported on 07/15/2016) 60 tablet 1  . zolpidem (AMBIEN) 10 MG tablet TAKE 1  TABLET BY MOUTH AT BEDTIME AS NEEDED (Patient not taking: Reported on 07/15/2016) 90 tablet 0   No facility-administered medications prior to visit.     ROS Review of Systems  Constitutional: Negative for appetite change, fatigue and unexpected weight change.  HENT: Negative for congestion, nosebleeds, sneezing, sore throat and trouble swallowing.   Eyes: Negative for itching and visual disturbance.  Respiratory: Positive for cough and wheezing.   Cardiovascular: Negative for chest pain, palpitations and leg swelling.  Gastrointestinal: Negative for abdominal distention, blood in stool, diarrhea and nausea.  Genitourinary: Negative for frequency and hematuria.  Musculoskeletal: Negative for back pain, gait problem, joint swelling and neck pain.  Skin: Negative for rash.  Neurological: Negative for dizziness, tremors, speech difficulty and weakness.  Psychiatric/Behavioral: Positive for sleep disturbance. Negative for agitation, dysphoric mood and suicidal ideas. The patient is not nervous/anxious.     Objective:  BP 122/86   Pulse 84   Temp 98.3 F (36.8 C) (Oral)   Wt 225 lb (102.1 kg)   SpO2 95%   BMI 30.52 kg/m   BP Readings from Last 3 Encounters:  07/15/16 122/86  10/13/15 122/80  06/12/15 100/60    Wt Readings from Last 3 Encounters:  07/15/16 225 lb (102.1 kg)  10/13/15 226 lb (102.5 kg)  06/12/15 222 lb (100.7 kg)    Physical Exam  Constitutional: He is oriented to  person, place, and time. He appears well-developed. No distress.  NAD  HENT:  Mouth/Throat: Oropharynx is clear and moist.  Eyes: Conjunctivae are normal. Pupils are equal, round, and reactive to light.  Neck: Normal range of motion. No JVD present. No thyromegaly present.  Cardiovascular: Normal rate, regular rhythm, normal heart sounds and intact distal pulses.  Exam reveals no gallop and no friction rub.   No murmur heard. Pulmonary/Chest: Effort normal and breath sounds normal. No respiratory  distress. He has no wheezes. He has no rales. He exhibits no tenderness.  Abdominal: Soft. Bowel sounds are normal. He exhibits no distension and no mass. There is no tenderness. There is no rebound and no guarding.  Musculoskeletal: Normal range of motion. He exhibits no edema or tenderness.  Lymphadenopathy:    He has no cervical adenopathy.  Neurological: He is alert and oriented to person, place, and time. He has normal reflexes. No cranial nerve deficit. He exhibits normal muscle tone. He displays a negative Romberg sign. Coordination and gait normal.  Skin: Skin is warm and dry. No rash noted.  Psychiatric: He has a normal mood and affect. His behavior is normal. Judgment and thought content normal.    Lab Results  Component Value Date   WBC 7.5 10/12/2015   HGB 14.7 10/12/2015   HCT 43.0 10/12/2015   PLT 226.0 10/12/2015   GLUCOSE 133 (H) 06/12/2015   CHOL 239 (H) 06/12/2015   TRIG 163.0 (H) 06/12/2015   HDL 52.20 06/12/2015   LDLDIRECT 153.3 05/13/2011   LDLCALC 154 (H) 06/12/2015   ALT 26 06/12/2015   AST 15 06/12/2015   NA 138 06/12/2015   K 3.6 06/12/2015   CL 101 06/12/2015   CREATININE 0.98 06/12/2015   BUN 14 06/12/2015   CO2 26 06/12/2015   TSH 1.78 06/12/2015   PSA 1.31 06/12/2015    Dg Chest 2 View  Result Date: 11/18/2015 CLINICAL DATA:  Cough, congestion, shortness of breath for 6 weeks EXAM: CHEST  2 VIEW COMPARISON:  06/08/2010 FINDINGS: Lingular scarring. Right lung is clear. Heart is normal size. No effusions or acute bony abnormality. IMPRESSION: Lingular scarring.  No active disease. Electronically Signed   By: Rolm Baptise M.D.   On: 11/18/2015 11:12    Assessment & Plan:   There are no diagnoses linked to this encounter. I am having Stephen Blankenship maintain his omega-3 acid ethyl esters, EPINEPHrine, buPROPion, traMADol, pseudoephedrine, clobetasol ointment, loratadine, fluticasone furoate-vilanterol, promethazine-codeine, benzonatate, zolpidem, and  albuterol.  No orders of the defined types were placed in this encounter.    Follow-up: No Follow-up on file.  Walker Kehr, MD

## 2016-07-15 NOTE — Progress Notes (Signed)
Pre visit review using our clinic review tool, if applicable. No additional management support is needed unless otherwise documented below in the visit note. 

## 2016-07-15 NOTE — Assessment & Plan Note (Signed)
Prom-cod syr 

## 2016-09-16 DIAGNOSIS — D1801 Hemangioma of skin and subcutaneous tissue: Secondary | ICD-10-CM | POA: Diagnosis not present

## 2016-09-16 DIAGNOSIS — L821 Other seborrheic keratosis: Secondary | ICD-10-CM | POA: Diagnosis not present

## 2016-09-16 DIAGNOSIS — D235 Other benign neoplasm of skin of trunk: Secondary | ICD-10-CM | POA: Diagnosis not present

## 2016-11-01 ENCOUNTER — Encounter: Payer: Self-pay | Admitting: Internal Medicine

## 2016-11-01 ENCOUNTER — Ambulatory Visit (INDEPENDENT_AMBULATORY_CARE_PROVIDER_SITE_OTHER): Payer: 59 | Admitting: Internal Medicine

## 2016-11-01 ENCOUNTER — Ambulatory Visit (INDEPENDENT_AMBULATORY_CARE_PROVIDER_SITE_OTHER)
Admission: RE | Admit: 2016-11-01 | Discharge: 2016-11-01 | Disposition: A | Discharge status: Home / Self Care | Payer: 59 | Source: Ambulatory Visit | Attending: Internal Medicine | Admitting: Internal Medicine

## 2016-11-01 DIAGNOSIS — J181 Lobar pneumonia, unspecified organism: Secondary | ICD-10-CM

## 2016-11-01 DIAGNOSIS — L247 Irritant contact dermatitis due to plants, except food: Secondary | ICD-10-CM

## 2016-11-01 DIAGNOSIS — R05 Cough: Secondary | ICD-10-CM | POA: Diagnosis not present

## 2016-11-01 DIAGNOSIS — J189 Pneumonia, unspecified organism: Secondary | ICD-10-CM

## 2016-11-01 DIAGNOSIS — L259 Unspecified contact dermatitis, unspecified cause: Secondary | ICD-10-CM | POA: Insufficient documentation

## 2016-11-01 DIAGNOSIS — R059 Cough, unspecified: Secondary | ICD-10-CM

## 2016-11-01 MED ORDER — TRIAMCINOLONE ACETONIDE 0.5 % EX CREA: TOPICAL_CREAM | CUTANEOUS | 1 refills | Status: DC

## 2016-11-01 MED ORDER — METHYLPREDNISOLONE 4 MG PO TBPK: ORAL_TABLET | ORAL | 1 refills | Status: DC

## 2016-11-01 MED ORDER — METHYLPREDNISOLONE ACETATE 80 MG/ML IJ SUSP
80.0000 mg | Freq: Once | INTRAMUSCULAR | Status: AC
  Administered 2016-11-01: 80 mg via INTRAMUSCULAR

## 2016-11-01 NOTE — Progress Notes (Signed)
Pre visit review using our clinic review tool, if applicable. No additional management support is needed unless otherwise documented below in the visit note. 

## 2016-11-01 NOTE — Assessment & Plan Note (Signed)
resolved 

## 2016-11-01 NOTE — Progress Notes (Signed)
Subjective:  Patient ID: Stephen Blankenship, male    DOB: 08/24/1970  Age: 46 y.o. MRN: [id number]  CC: No chief complaint on file.   HPI Stephen Blankenship presents for a rash on face and ear x3 d - worse Tried OTC meds F/u CAP w/cough - resolving  Outpatient Medications Prior to Visit  Medication Sig Dispense Refill  . albuterol (PROVENTIL HFA;VENTOLIN HFA) 108 (90 Base) MCG/ACT inhaler Inhale 2 puffs into the lungs every 6 (six) hours as needed for wheezing or shortness of breath. 1 Inhaler 1  . amoxicillin-clavulanate (AUGMENTIN) 875-125 MG tablet Take 1 tablet by mouth 2 (two) times daily. 20 tablet 0  . benzonatate (TESSALON) 200 MG capsule Take 1 capsule (200 mg total) by mouth 3 (three) times daily as needed for cough. 60 capsule 0  . clobetasol ointment (TEMOVATE) 0.05 % Apply topically 2 (two) times daily as needed. 60 g 3  . EPINEPHrine (EPIPEN 2-PAK) 0.3 mg/0.3 mL DEVI Inject 0.3 mLs (0.3 mg total) into the muscle once. 1 Device 3  . fluticasone furoate-vilanterol (BREO ELLIPTA) 100-25 MCG/INH AEPB Inhale 1 puff into the lungs daily. 1 each 5  . promethazine-codeine (PHENERGAN WITH CODEINE) 6.25-10 MG/5ML syrup Take 5 mLs by mouth every 4 (four) hours as needed. 300 mL 0  . traMADol (ULTRAM) 50 MG tablet Take 1-2 tablets (50-100 mg total) by mouth every 6 (six) hours as needed. 40 tablet 0  . zolpidem (AMBIEN) 10 MG tablet Take 1 tablet (10 mg total) by mouth at bedtime as needed. 90 tablet 1  . omega-3 acid ethyl esters (LOVAZA) 1 G capsule Take 2 capsules (2 g total) by mouth 2 (two) times daily. 120 capsule 11  . tadalafil (CIALIS) 20 MG tablet Take 1 tablet (20 mg total) by mouth daily as needed for erectile dysfunction. 30 tablet 5  . buPROPion (WELLBUTRIN XL) 150 MG 24 hr tablet Take 1 tablet (150 mg total) by mouth daily. (Patient not taking: Reported on 07/15/2016) 30 tablet 5  . loratadine (CLARITIN) 10 MG tablet Take 1 tablet (10 mg total) by mouth daily. (Patient not taking:  Reported on 07/15/2016) 100 tablet 3  . pseudoephedrine (SUDAFED) 30 MG tablet Take 1-2 tablets (30-60 mg total) by mouth every 4 (four) hours as needed for congestion. (Patient not taking: Reported on 07/15/2016) 60 tablet 1   No facility-administered medications prior to visit.     ROS Review of Systems  Constitutional: Negative for appetite change, fatigue and unexpected weight change.  HENT: Negative for congestion, nosebleeds, sneezing, sore throat and trouble swallowing.   Eyes: Negative for itching and visual disturbance.  Respiratory: Negative for cough.   Cardiovascular: Negative for chest pain, palpitations and leg swelling.  Gastrointestinal: Negative for abdominal distention, blood in stool, diarrhea and nausea.  Genitourinary: Negative for frequency and hematuria.  Musculoskeletal: Negative for back pain, gait problem, joint swelling and neck pain.  Skin: Positive for rash.  Neurological: Negative for dizziness, tremors, speech difficulty and weakness.  Psychiatric/Behavioral: Negative for agitation, dysphoric mood and sleep disturbance. The patient is not nervous/anxious.     Objective:  BP 122/68 (BP Location: Left Arm, Patient Position: Sitting, Cuff Size: Large)   Pulse 91   Temp 98.4 F (36.9 C) (Oral)   Ht 6' (1.829 m)   Wt 225 lb (102.1 kg)   SpO2 99%   BMI 30.52 kg/m   BP Readings from Last 3 Encounters:  11/01/16 122/68  07/15/16 122/86  10/13/15 122/80  Wt Readings from Last 3 Encounters:  11/01/16 225 lb (102.1 kg)  07/15/16 225 lb (102.1 kg)  10/13/15 226 lb (102.5 kg)    Physical Exam  Constitutional: He is oriented to person, place, and time. He appears well-developed. No distress.  NAD  HENT:  Mouth/Throat: Oropharynx is clear and moist.  Eyes: Conjunctivae are normal. Pupils are equal, round, and reactive to light.  Neck: Normal range of motion. No JVD present. No thyromegaly present.  Cardiovascular: Normal rate, regular rhythm, normal  heart sounds and intact distal pulses.  Exam reveals no gallop and no friction rub.   No murmur heard. Pulmonary/Chest: Effort normal and breath sounds normal. No respiratory distress. He has no wheezes. He has no rales. He exhibits no tenderness.  Abdominal: Soft. Bowel sounds are normal. He exhibits no distension and no mass. There is no tenderness. There is no rebound and no guarding.  Musculoskeletal: Normal range of motion. He exhibits no edema or tenderness.  Lymphadenopathy:    He has no cervical adenopathy.  Neurological: He is alert and oriented to person, place, and time. He has normal reflexes. No cranial nerve deficit. He exhibits normal muscle tone. He displays a negative Romberg sign. Coordination and gait normal.  Skin: Skin is warm and dry. Rash noted.  Psychiatric: He has a normal mood and affect. His behavior is normal. Judgment and thought content normal.  face rash, L ear rash, around eyes, forearms  Lab Results  Component Value Date   WBC 7.5 10/12/2015   HGB 14.7 10/12/2015   HCT 43.0 10/12/2015   PLT 226.0 10/12/2015   GLUCOSE 133 (H) 06/12/2015   CHOL 239 (H) 06/12/2015   TRIG 163.0 (H) 06/12/2015   HDL 52.20 06/12/2015   LDLDIRECT 153.3 05/13/2011   LDLCALC 154 (H) 06/12/2015   ALT 26 06/12/2015   AST 15 06/12/2015   NA 138 06/12/2015   K 3.6 06/12/2015   CL 101 06/12/2015   CREATININE 0.98 06/12/2015   BUN 14 06/12/2015   CO2 26 06/12/2015   TSH 1.78 06/12/2015   PSA 1.31 06/12/2015    Dg Chest 2 View  Result Date: 11/18/2015 CLINICAL DATA:  Cough, congestion, shortness of breath for 6 weeks EXAM: CHEST  2 VIEW COMPARISON:  06/08/2010 FINDINGS: Lingular scarring. Right lung is clear. Heart is normal size. No effusions or acute bony abnormality. IMPRESSION: Lingular scarring.  No active disease. Electronically Signed   By: Rolm Baptise M.D.   On: 11/18/2015 11:12    Assessment & Plan:   There are no diagnoses linked to this encounter. I have  discontinued Mr. Gironda buPROPion, pseudoephedrine, and loratadine. I am also having him maintain his omega-3 acid ethyl esters, EPINEPHrine, traMADol, clobetasol ointment, fluticasone furoate-vilanterol, benzonatate, albuterol, zolpidem, promethazine-codeine, tadalafil, and amoxicillin-clavulanate.  No orders of the defined types were placed in this encounter.    Follow-up: No Follow-up on file.  Walker Kehr, MD

## 2016-11-01 NOTE — Addendum Note (Signed)
Addended by: Karren Cobble on: 11/01/2016 04:49 PM   Modules accepted: Orders

## 2016-11-01 NOTE — Patient Instructions (Signed)
Poison Ivy Dermatitis Poison ivy dermatitis is inflammation of the skin that is caused by the allergens on the leaves of the poison ivy plant. The skin reaction often involves redness, swelling, blisters, and extreme itching. What are the causes? This condition is caused by a specific chemical (urushiol) found in the sap of the poison ivy plant. This chemical is sticky and can be easily spread to people, animals, and objects. You can get poison ivy dermatitis by:  Having direct contact with a poison ivy plant.  Touching animals, other people, or objects that have come in contact with poison ivy and have the chemical on them. What increases the risk? This condition is more likely to develop in:  People who are outdoors often.  People who go outdoors without wearing protective clothing, such as closed shoes, long pants, and a long-sleeved shirt. What are the signs or symptoms? Symptoms of this condition include:  Redness and itching.  A rash that often includes bumps and blisters. The rash usually appears 48 hours after exposure.  Swelling. This may occur if the reaction is more severe. Symptoms usually last for 1-2 weeks. However, the first time you develop this condition, symptoms may last 3-4 weeks. How is this diagnosed? This condition may be diagnosed based on your symptoms and a physical exam. Your health care provider may also ask you about any recent outdoor activity. How is this treated? Treatment for this condition will vary depending on how severe it is. Treatment may include:  Hydrocortisone creams or calamine lotions to relieve itching.  Oatmeal baths to soothe the skin.  Over-the-counter antihistamine tablets.  Oral steroid medicine for more severe outbreaks. Follow these instructions at home:  Take or apply over-the-counter and prescription medicines only as told by your health care provider.  Wash exposed skin as soon as possible with soap and cold water.  Use  hydrocortisone creams or calamine lotion as needed to soothe the skin and relieve itching.  Take oatmeal baths as needed. Use colloidal oatmeal. You can get this at your local pharmacy or grocery store. Follow the instructions on the packaging.  Do not scratch or rub your skin.  While you have the rash, wash clothes right after you wear them. How is this prevented?  Learn to identify the poison ivy plant and avoid contact with the plant. This plant can be recognized by the number of leaves. Generally, poison ivy has three leaves with flowering branches on a single stem. The leaves are typically glossy, and they have jagged edges that come to a point at the front.  If you have been exposed to poison ivy, thoroughly wash with soap and water right away. You have about 30 minutes to remove the plant resin before it will cause the rash. Be sure to wash under your fingernails because any plant resin there will continue to spread the rash.  When hiking or camping, wear clothes that will help you to avoid exposure on the skin. This includes long pants, a long-sleeved shirt, tall socks, and hiking boots. You can also apply preventive lotion to your skin to help limit exposure.  If you suspect that your clothes or outdoor gear came in contact with poison ivy, rinse them off outside with a garden hose before you bring them inside your house. Contact a health care provider if:  You have open sores in the rash area.  You have more redness, swelling, or pain in the affected area.  You have redness that spreads beyond  the rash area.  You have fluid, blood, or pus coming from the affected area.  You have a fever.  You have a rash over a large area of your body.  You have a rash on your eyes, mouth, or genitals.  Your rash does not improve after a few days. Get help right away if:  Your face swells or your eyes swell shut.  You have trouble breathing.  You have trouble swallowing. This  information is not intended to replace advice given to you by your health care provider. Make sure you discuss any questions you have with your health care provider. Document Released: 06/24/2000 Document Revised: 12/03/2015 Document Reviewed: 12/03/2014 Elsevier Interactive Patient Education  2017 [employer].

## 2016-11-01 NOTE — Assessment & Plan Note (Signed)
Resolved

## 2016-11-01 NOTE — Assessment & Plan Note (Signed)
Depo-medrol 80 mg Benadryl Claritin Medrol dose pack Kenalog cream

## 2016-12-21 ENCOUNTER — Other Ambulatory Visit: Payer: Self-pay | Admitting: Internal Medicine

## 2016-12-23 NOTE — Telephone Encounter (Signed)
[facility] 

## 2017-02-06 ENCOUNTER — Other Ambulatory Visit (INDEPENDENT_AMBULATORY_CARE_PROVIDER_SITE_OTHER): Payer: 59

## 2017-02-06 ENCOUNTER — Encounter: Payer: Self-pay | Admitting: Family Medicine

## 2017-02-06 ENCOUNTER — Ambulatory Visit (INDEPENDENT_AMBULATORY_CARE_PROVIDER_SITE_OTHER): Payer: 59 | Admitting: Family Medicine

## 2017-02-06 ENCOUNTER — Other Ambulatory Visit: Payer: Self-pay

## 2017-02-06 VITALS — BP 110/78 | HR 84 | Temp 98.5°F | Ht 72.0 in | Wt 221.0 lb

## 2017-02-06 DIAGNOSIS — R5383 Other fatigue: Secondary | ICD-10-CM

## 2017-02-06 DIAGNOSIS — Z7721 Contact with and (suspected) exposure to potentially hazardous body fluids: Secondary | ICD-10-CM

## 2017-02-06 DIAGNOSIS — R5381 Other malaise: Secondary | ICD-10-CM

## 2017-02-06 DIAGNOSIS — M791 Myalgia, unspecified site: Secondary | ICD-10-CM

## 2017-02-06 LAB — CBC WITH DIFFERENTIAL/PLATELET
BASOS ABS: 0 10*3/uL (ref 0.0–0.1)
Basophils Relative: 0.5 % (ref 0.0–3.0)
Eosinophils Absolute: 0 10*3/uL (ref 0.0–0.7)
Eosinophils Relative: 0.7 % (ref 0.0–5.0)
HCT: 42.6 % (ref 39.0–52.0)
Hemoglobin: 14.6 g/dL (ref 13.0–17.0)
LYMPHS ABS: 1.3 10*3/uL (ref 0.7–4.0)
LYMPHS PCT: 24.1 % (ref 12.0–46.0)
MCHC: 34.3 g/dL (ref 30.0–36.0)
MCV: 88.3 fl (ref 78.0–100.0)
Monocytes Absolute: 1.1 10*3/uL — ABNORMAL HIGH (ref 0.1–1.0)
Monocytes Relative: 20.4 % — ABNORMAL HIGH (ref 3.0–12.0)
NEUTROS ABS: 3 10*3/uL (ref 1.4–7.7)
NEUTROS PCT: 54.3 % (ref 43.0–77.0)
PLATELETS: 201 10*3/uL (ref 150.0–400.0)
RBC: 4.83 Mil/uL (ref 4.22–5.81)
RDW: 13.2 % (ref 11.5–15.5)
WBC: 5.5 10*3/uL (ref 4.0–10.5)

## 2017-02-06 LAB — COMPREHENSIVE METABOLIC PANEL
ALK PHOS: 57 U/L (ref 39–117)
ALT: 25 U/L (ref 0–53)
AST: 25 U/L (ref 0–37)
Albumin: 4.2 g/dL (ref 3.5–5.2)
BUN: 16 mg/dL (ref 6–23)
CO2: 27 mEq/L (ref 19–32)
CREATININE: 1.03 mg/dL (ref 0.40–1.50)
Calcium: 8.8 mg/dL (ref 8.4–10.5)
Chloride: 100 mEq/L (ref 96–112)
GFR: 82.17 mL/min (ref >60.00)
Glucose, Bld: 100 mg/dL — ABNORMAL HIGH (ref 70–99)
Potassium: 3.6 mEq/L (ref 3.5–5.1)
Sodium: 136 mEq/L (ref 135–145)
Total Bilirubin: 0.5 mg/dL (ref 0.2–1.2)
Total Protein: 7.4 g/dL (ref 6.0–8.3)

## 2017-02-06 LAB — C-REACTIVE PROTEIN: CRP: 4.4 mg/dL (ref 0.5–20.0)

## 2017-02-06 LAB — MONONUCLEOSIS SCREEN: Mono Screen: NEGATIVE

## 2017-02-06 LAB — SEDIMENTATION RATE: Sed Rate: 27 mm/hr — ABNORMAL HIGH (ref 0–15)

## 2017-02-06 MED ORDER — DOXYCYCLINE HYCLATE 100 MG PO TABS: 100.0000 mg | ORAL_TABLET | Freq: Two times a day (BID) | ORAL | 0 refills | Status: DC

## 2017-02-06 NOTE — Assessment & Plan Note (Signed)
Unclear presentation of the source of his problems. Could be associated with tickborne vector did not demonstrate any rash. Is having malaise, fatigue, and fever. Possible for mono. Reviewed lab work - Doxycycline for 14 days. - CBC with differential, CMP, ESR, CRP, Monospot, EBV - Given indications to return in follow-up

## 2017-02-06 NOTE — Patient Instructions (Signed)
Thank you for coming in,   We will call with results from your lab work today. I sent the doxycycline and please take a probiotic with this. Please let us know if her symptoms worsen.    Please feel free to call with any questions or concerns at any time, at [phone]. --Dr. Raeford Razor

## 2017-02-06 NOTE — Progress Notes (Signed)
Stephen Blankenship - 46 y.o. male MRN [id number]  Date of birth: 1971/03/30  SUBJECTIVE:  Including CC & ROS.  Chief Complaint  Patient presents with  . 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 patient has taked hydrocodone and tylenol which has helped slighlty    Stephen Blankenship is a 46 year old male is presenting with fever, malaise, weakness, myalgias. He reports his symptoms been ongoing since last week. His fever finally broke yesterday. His fever has been up to 102.7. He reports that he pulled off a couple of ticks off of his body is unsure how long they were attached. He did not report a significant rash after this occurred. He feels weak and having trouble moving around. He does have pain in his arm but not the joints. He denies any dysuria, constipation, or diarrhea. He has been taking hydrocodone to help with the pain. NSAIDs have not been helping. He denies any travel or new pet exposure. He had a partner with a viral illness last week. He is in a monogamous relationship with no risk of sexual transmitted disease.     Review of Systems  Constitutional: Positive for fever. Negative for chills.  Respiratory: Negative for shortness of breath.   Gastrointestinal: Negative for constipation and nausea.  Musculoskeletal: Positive for myalgias. Negative for arthralgias.  Neurological: Positive for weakness. Negative for numbness.  Hematological: Negative for adenopathy.  Psychiatric/Behavioral: Negative for behavioral problems. The patient is not nervous/anxious.     HISTORY: Past Medical, Surgical, Social, and Family History Reviewed & Updated per EMR.   Pertinent Historical Findings include:  Past Medical History:  Diagnosis Date  . Allergy to bee sting   . Asthma   . Ear drum perforation      left  . History of kidney stones    has urologist  . Hyperlipidemia   . Right knee injury     Past Surgical History:  Procedure Laterality Date  . EYE SURGERY    . TYMPANOPLASTY  2011   Left    Allergies  Allergen Reactions  . Rosuvastatin     REACTION: myalgias    Family History  Problem Relation Age of Onset  . Hyperlipidemia Other   . Coronary artery disease Neg Hx      Social History   Social History  . Marital status: Married    Spouse name: N/A  . Number of children: 1  . Years of education: N/A   Occupational History  .  [place] History Main Topics  . Smoking status: Former Smoker    Quit date: 03/15/2011  . Smokeless tobacco: Current User  . Alcohol use 1.8 oz/week    3 Glasses of wine per week  . Drug use: No  . Sexual activity: Yes   Other Topics Concern  . Not on file   Social History Narrative   Regular Exercise- yes           PHYSICAL EXAM:  VS: BP 110/78 (BP Location: Left Arm, Patient Position: Sitting, Cuff Size: Normal)   Pulse 84   Temp 98.5 F (36.9 C) (Oral)   Ht 6' (1.829 m)   Wt 221 lb (100.2 kg)   SpO2 98%   BMI 29.97 kg/m  Physical Exam  Constitutional: He is oriented to person, place, and time. He appears well-developed and well-nourished.  HENT:  Head: Normocephalic and atraumatic.  Right Ear: External ear normal.  Left Ear: External ear normal.  Eyes: Conjunctivae and EOM are normal.  Neck: Normal range of motion. Neck supple.  Cardiovascular: Normal rate, regular rhythm and normal heart sounds.   No murmur heard. Pulmonary/Chest: Effort normal. He has no rales.  Abdominal: Soft. Bowel sounds are normal. There is no tenderness.  Musculoskeletal: Normal range of motion. He exhibits no edema.  Pain and weakness with upper arm flexion and extension. Pain and weakness with lower leg flexion and extension. Normal gait. Pain to palpation of the upper extremity muscles in the lower extreme  muscles. No joint swelling. No ecchymosis or rashes.  Neurovascularly intact  Lymphadenopathy:    He has no cervical adenopathy.  Neurological: He is alert and oriented to person, place, and time. He displays normal reflexes.  Skin: Skin is warm. No rash noted.  Psychiatric: He has a normal mood and affect. His behavior is normal.     ASSESSMENT & PLAN:   Myalgia Unclear presentation of the source of his problems. Could be associated with tickborne vector did not demonstrate any rash. Is having malaise, fatigue, and fever. Possible for mono. Reviewed lab work - Doxycycline for 14 days. - CBC with differential, CMP, ESR, CRP, Monospot, EBV - Given indications to return in follow-up

## 2017-02-07 LAB — HIV ANTIBODY (ROUTINE TESTING W REFLEX): HIV 1&2 Ab, 4th Generation: NONREACTIVE

## 2017-02-07 LAB — PATHOLOGIST SMEAR REVIEW

## 2017-02-07 LAB — EPSTEIN-BARR VIRUS NUCLEAR ANTIGEN ANTIBODY, IGG: EBV NA IgG: 32.2 U/mL — ABNORMAL HIGH

## 2017-02-07 MED ORDER — TRAMADOL HCL 50 MG PO TABS: 50.0000 mg | ORAL_TABLET | Freq: Three times a day (TID) | ORAL | 0 refills | Status: DC | PRN

## 2017-02-07 MED ORDER — MELOXICAM 15 MG PO TABS: 15.0000 mg | ORAL_TABLET | Freq: Every day | ORAL | 1 refills | Status: DC

## 2017-02-07 NOTE — Telephone Encounter (Signed)
Called patient and informed him of his lab results. He is having significant swelling and continued malaise today. He has had no fever. We will try meloxicam and tramadol for his pain. He will keep me informed on how he is doing. He was given indications to seek immediate care.  Rosemarie Ax, MD [facility] 02/07/2017, 2:07 PM

## 2017-02-07 NOTE — Telephone Encounter (Signed)
Patient called to follow up. I informed him of the notes. He does want something called in for the pain he also states he thinks he need a anti-inflammatory medication as well. Please use :  [id number] - [place], [place] [address] [phone] (Phone) [phone] (Fax)   He is very worried about what is going on with him. He asked if you have any more information as far as that goes to please follow up with. He does also need a work note.   Dr.Schmitz I can type you one up if you like. How many days out?   Please advise. Thank you.

## 2017-02-07 NOTE — Telephone Encounter (Signed)
Pt called regarding this he would like a call back, states his rms are swelling and he can barely move them.  He would also like his test results.  Please call back ASAP.

## 2017-02-23 ENCOUNTER — Ambulatory Visit (INDEPENDENT_AMBULATORY_CARE_PROVIDER_SITE_OTHER): Payer: 59 | Admitting: Internal Medicine

## 2017-02-23 ENCOUNTER — Other Ambulatory Visit (INDEPENDENT_AMBULATORY_CARE_PROVIDER_SITE_OTHER): Payer: 59

## 2017-02-23 ENCOUNTER — Encounter: Payer: Self-pay | Admitting: Internal Medicine

## 2017-02-23 VITALS — BP 140/90 | HR 77 | Temp 98.5°F | Ht 72.0 in | Wt 225.0 lb

## 2017-02-23 DIAGNOSIS — M791 Myalgia, unspecified site: Secondary | ICD-10-CM

## 2017-02-23 DIAGNOSIS — R509 Fever, unspecified: Secondary | ICD-10-CM

## 2017-02-23 DIAGNOSIS — J4520 Mild intermittent asthma, uncomplicated: Secondary | ICD-10-CM

## 2017-02-23 LAB — CBC
HCT: 43.3 % (ref 39.0–52.0)
Hemoglobin: 14.5 g/dL (ref 13.0–17.0)
MCHC: 33.5 g/dL (ref 30.0–36.0)
MCV: 89.2 fl (ref 78.0–100.0)
Platelets: 275 10*3/uL (ref 150.0–400.0)
RBC: 4.85 Mil/uL (ref 4.22–5.81)
RDW: 13.1 % (ref 11.5–15.5)
WBC: 7.1 10*3/uL (ref 4.0–10.5)

## 2017-02-23 LAB — SEDIMENTATION RATE: SED RATE: 15 mm/h (ref 0–15)

## 2017-02-23 MED ORDER — CLOBETASOL PROPIONATE 0.05 % EX OINT: TOPICAL_OINTMENT | Freq: Two times a day (BID) | CUTANEOUS | 3 refills | Status: DC | PRN

## 2017-02-23 NOTE — Progress Notes (Signed)
Subjective:  Patient ID: Stephen Blankenship, male    DOB: [birth year]  Age: 46 y.o. MRN: [id number]  CC: No chief complaint on file.   HPI MUREL SHENBERGER presents for tick bites on July 20th during [place]hunt [place] fields[place]woods. He developed fever, arthralgia and rash in 7 days. He took Doxy x 14 d, Tramadol. C/o numbness, pain in fingertips and rash  Outpatient Medications Prior to Visit  Medication Sig Dispense Refill  . meloxicam (MOBIC) 15 MG tablet Take 1 tablet (15 mg total) by mouth daily. 30 tablet 1  . triamcinolone cream (KENALOG) 0.5 % Use bid-qid 45 g 1  . zolpidem (AMBIEN) 10 MG tablet TAKE 1 TABLET AT BEDTIME AS NEEDED 90 tablet 1  . albuterol (PROVENTIL HFA;VENTOLIN HFA) 108 (90 Base) MCG/ACT inhaler Inhale 2 puffs into the lungs every 6 (six) hours as needed for wheezing or shortness of breath. (Patient not taking: Reported on 02/06/2017) 1 Inhaler 1  . benzonatate (TESSALON) 200 MG capsule Take 1 capsule (200 mg total) by mouth 3 (three) times daily as needed for cough. (Patient not taking: Reported on 02/06/2017) 60 capsule 0  . clobetasol ointment (TEMOVATE) 0.05 % Apply topically 2 (two) times daily as needed. (Patient not taking: Reported on 02/06/2017) 60 g 3  . doxycycline (VIBRA-TABS) 100 MG tablet Take 1 tablet (100 mg total) by mouth 2 (two) times daily. For a total of 14 days. (Patient not taking: Reported on 02/23/2017) 28 tablet 0  . EPINEPHrine (EPIPEN 2-PAK) 0.3 mg/0.3 mL DEVI Inject 0.3 mLs (0.3 mg total) into the muscle once. (Patient not taking: Reported on 02/06/2017) 1 Device 3  . fluticasone furoate-vilanterol (BREO ELLIPTA) 100-25 MCG/INH AEPB Inhale 1 puff into the lungs daily. (Patient not taking: Reported on 02/06/2017) 1 each 5  . methylPREDNISolone (MEDROL DOSEPAK) 4 MG TBPK tablet As directed (Patient not taking: Reported on 02/06/2017) 21 tablet 1  . omega-3 acid ethyl esters (LOVAZA) 1 G capsule Take 2 capsules (2 g total) by mouth 2 (two) times daily. 120  capsule 11  . promethazine-codeine (PHENERGAN WITH CODEINE) 6.25-10 MG/5ML syrup Take 5 mLs by mouth every 4 (four) hours as needed. (Patient not taking: Reported on 02/06/2017) 300 mL 0  . tadalafil (CIALIS) 20 MG tablet Take 1 tablet (20 mg total) by mouth daily as needed for erectile dysfunction. 30 tablet 5  . traMADol (ULTRAM) 50 MG tablet Take 1 tablet (50 mg total) by mouth every 8 (eight) hours as needed. (Patient not taking: Reported on 02/23/2017) 30 tablet 0   No facility-administered medications prior to visit.     ROS Review of Systems  Constitutional: Positive for fatigue. Negative for appetite change and unexpected weight change.  HENT: Negative for congestion, nosebleeds, sneezing, sore throat and trouble swallowing.   Eyes: Negative for itching and visual disturbance.  Respiratory: Negative for cough.   Cardiovascular: Negative for chest pain, palpitations and leg swelling.  Gastrointestinal: Negative for abdominal distention, blood in stool, diarrhea and nausea.  Genitourinary: Negative for frequency and hematuria.  Musculoskeletal: Positive for arthralgias. Negative for back pain, gait problem, joint swelling and neck pain.  Skin: Negative for rash.  Neurological: Negative for dizziness, tremors, speech difficulty and weakness.  Psychiatric/Behavioral: Negative for agitation, dysphoric mood and sleep disturbance. The patient is not nervous/anxious.     Objective:  BP 140/90 (BP Location: Left Arm, Patient Position: Sitting, Cuff Size: Normal)   Pulse 77   Temp 98.5 F (36.9 C) (Oral)  Ht 6' (1.829 m)   Wt 225 lb (102.1 kg)   SpO2 99%   BMI 30.52 kg/m   BP Readings from Last 3 Encounters:  02/23/17 140/90  02/06/17 110/78  11/01/16 122/68    Wt Readings from Last 3 Encounters:  02/23/17 225 lb (102.1 kg)  02/06/17 221 lb (100.2 kg)  11/01/16 225 lb (102.1 kg)    Physical Exam  Constitutional: He is oriented to person, place, and time. He appears  well-developed. No distress.  NAD  HENT:  Mouth/Throat: Oropharynx is clear and moist.  Eyes: Pupils are equal, round, and reactive to light. Conjunctivae are normal.  Neck: Normal range of motion. No JVD present. No thyromegaly present.  Cardiovascular: Normal rate, regular rhythm, normal heart sounds and intact distal pulses.  Exam reveals no gallop and no friction rub.   No murmur heard. Pulmonary/Chest: Effort normal and breath sounds normal. No respiratory distress. He has no wheezes. He has no rales. He exhibits no tenderness.  Abdominal: Soft. Bowel sounds are normal. He exhibits no distension and no mass. There is no tenderness. There is no rebound and no guarding.  Musculoskeletal: Normal range of motion. He exhibits no edema or tenderness.  Lymphadenopathy:    He has no cervical adenopathy.  Neurological: He is alert and oriented to person, place, and time. He has normal reflexes. No cranial nerve deficit. He exhibits normal muscle tone. He displays a negative Romberg sign. Coordination and gait normal.  Skin: Skin is warm and dry. No rash noted.  Psychiatric: He has a normal mood and affect. His behavior is normal. Judgment and thought content normal.    Lab Results  Component Value Date   WBC 5.5 02/06/2017   HGB 14.6 02/06/2017   HCT 42.6 02/06/2017   PLT 201.0 02/06/2017   GLUCOSE 100 (H) 02/06/2017   CHOL 239 (H) 06/12/2015   TRIG 163.0 (H) 06/12/2015   HDL 52.20 06/12/2015   LDLDIRECT 153.3 05/13/2011   LDLCALC 154 (H) 06/12/2015   ALT 25 02/06/2017   AST 25 02/06/2017   NA 136 02/06/2017   K 3.6 02/06/2017   CL 100 02/06/2017   CREATININE 1.03 02/06/2017   BUN 16 02/06/2017   CO2 27 02/06/2017   TSH 1.78 06/12/2015   PSA 1.31 06/12/2015    Dg Chest 2 View  Result Date: 11/02/2016 CLINICAL DATA:  History of pneumonia, followup, former smoking history EXAM: CHEST  2 VIEW COMPARISON:  Chest x-ray of 11/18/2015 FINDINGS: No active infiltrate or effusion is  seen. Mediastinal and hilar contours are unremarkable. Minimal linear scarring in the lingula or right middle lobe is stable. The heart is within normal limits in size. No bony abnormality is seen. IMPRESSION: No active cardiopulmonary disease. Electronically Signed   By: Ivar Drape M.D.   On: 11/02/2016 08:26    Assessment & Plan:   There are no diagnoses linked to this encounter. I am having Mr. Wamble maintain his omega-3 acid ethyl esters, EPINEPHrine, clobetasol ointment, fluticasone furoate-vilanterol, benzonatate, albuterol, promethazine-codeine, tadalafil, triamcinolone cream, methylPREDNISolone, zolpidem, doxycycline, meloxicam, and traMADol.  No orders of the defined types were placed in this encounter.    Follow-up: No Follow-up on file.  Walker Kehr, MD

## 2017-02-24 LAB — ROCKY MTN SPOTTED FVR ABS PNL(IGG+IGM)
RMSF IGG: NOT DETECTED
RMSF IgM: NOT DETECTED

## 2017-02-24 LAB — LYME ABY, WSTRN BLT IGG & IGM W/BANDS
B BURGDORFERI IGG ABS (IB): NEGATIVE
B BURGDORFERI IGM ABS (IB): NEGATIVE
LYME DISEASE 18 KD IGG: NONREACTIVE
LYME DISEASE 23 KD IGG: NONREACTIVE
LYME DISEASE 23 KD IGM: NONREACTIVE
LYME DISEASE 30 KD IGG: NONREACTIVE
LYME DISEASE 39 KD IGG: NONREACTIVE
LYME DISEASE 41 KD IGG: NONREACTIVE
LYME DISEASE 58 KD IGG: REACTIVE — AB
LYME DISEASE 66 KD IGG: NONREACTIVE
Lyme Disease 28 kD IgG: NONREACTIVE
Lyme Disease 39 kD IgM: REACTIVE — AB
Lyme Disease 41 kD IgM: NONREACTIVE
Lyme Disease 45 kD IgG: NONREACTIVE
Lyme Disease 93 kD IgG: NONREACTIVE

## 2017-02-26 ENCOUNTER — Encounter: Payer: Self-pay | Admitting: Internal Medicine

## 2017-02-26 DIAGNOSIS — R509 Fever, unspecified: Secondary | ICD-10-CM | POA: Insufficient documentation

## 2017-02-26 NOTE — Assessment & Plan Note (Addendum)
Likely a viral illness vs other -  tick bites on July 20th during [place]. He developed fever, arthralgia and rash in 7 days. He took Doxy x 14 d, Tramadol. C/o numbness, pain in fingertips and rash  Tramadol NSAIDs prn

## 2017-02-26 NOTE — Assessment & Plan Note (Signed)
Labs Likely a viral illness vs other

## 2017-02-26 NOTE — Assessment & Plan Note (Signed)
No sx's now ?

## 2017-03-10 ENCOUNTER — Encounter: Payer: Self-pay | Admitting: Internal Medicine

## 2017-03-23 ENCOUNTER — Encounter: Payer: Self-pay | Admitting: Internal Medicine

## 2017-03-30 ENCOUNTER — Encounter: Payer: Self-pay | Admitting: Family Medicine

## 2017-03-30 ENCOUNTER — Telehealth: Payer: Self-pay | Admitting: Internal Medicine

## 2017-03-30 NOTE — Progress Notes (Signed)
Mr. Stephen Blankenship attached in the updated letter.  Have a great day!

## 2017-03-30 NOTE — Telephone Encounter (Signed)
Patient called stating that he needed letter that Dr. Raeford Razor wrote back in July fixed.  On the letter it states appt date 7/31 when actual appt date was 7/30.  I am going to correct for patient.

## 2017-04-03 ENCOUNTER — Other Ambulatory Visit: Payer: Self-pay | Admitting: Internal Medicine

## 2017-04-03 DIAGNOSIS — M791 Myalgia, unspecified site: Secondary | ICD-10-CM

## 2017-04-10 NOTE — Telephone Encounter (Signed)
Since rx was unclear due to ink going out [facility] had to leave on pharmacy vm...Stephen Blankenship

## 2017-04-11 NOTE — Telephone Encounter (Signed)
Patient is requesting a call from the nurse. He wants to talk to her about this. Please follow up with patient once you can.

## 2017-04-14 ENCOUNTER — Encounter: Payer: Self-pay | Admitting: Internal Medicine

## 2017-04-14 ENCOUNTER — Telehealth: Payer: Self-pay

## 2017-04-14 ENCOUNTER — Ambulatory Visit (INDEPENDENT_AMBULATORY_CARE_PROVIDER_SITE_OTHER): Payer: 59 | Admitting: Internal Medicine

## 2017-04-14 DIAGNOSIS — M19049 Primary osteoarthritis, unspecified hand: Secondary | ICD-10-CM | POA: Diagnosis not present

## 2017-04-14 MED ORDER — VITAMIN D3 50 MCG (2000 UT) PO CAPS: 2000.0000 [IU] | ORAL_CAPSULE | Freq: Every day | ORAL | 3 refills | Status: DC

## 2017-04-14 MED ORDER — CLOBETASOL PROPIONATE 0.05 % EX OINT: TOPICAL_OINTMENT | Freq: Two times a day (BID) | CUTANEOUS | 3 refills | Status: DC | PRN

## 2017-04-14 NOTE — Assessment & Plan Note (Signed)
?  reactive Meloxicam Ref to Rheumatology - Dr Trudie Reed

## 2017-04-14 NOTE — Telephone Encounter (Signed)
LVM for pt to call back as soon as possible.   

## 2017-04-14 NOTE — Telephone Encounter (Signed)
Not able to start PA due to work [employer] comp case.

## 2017-04-14 NOTE — Patient Instructions (Signed)
Try Turmeric 

## 2017-04-14 NOTE — Progress Notes (Signed)
Subjective:  Patient ID: Stephen Blankenship, male    DOB: [birth year]  Age: 46 y.o. MRN: [id number]  CC: No chief complaint on file.   HPI Stephen Blankenship presents for B hand pain in the mid-MCP area - worse; more sx's in the morning. Pain is severe - better w/Aleve. C/o stiffness. F/u rash - better. F/u generalized arthralgias - better  Outpatient Medications Prior to Visit  Medication Sig Dispense Refill  . albuterol (PROVENTIL HFA;VENTOLIN HFA) 108 (90 Base) MCG/ACT inhaler Inhale 2 puffs into the lungs every 6 (six) hours as needed for wheezing or shortness of breath. 1 Inhaler 1  . benzonatate (TESSALON) 200 MG capsule Take 1 capsule (200 mg total) by mouth 3 (three) times daily as needed for cough. 60 capsule 0  . clobetasol ointment (TEMOVATE) 0.05 % Apply topically 2 (two) times daily as needed. 60 g 3  . doxycycline (VIBRA-TABS) 100 MG tablet Take 1 tablet (100 mg total) by mouth 2 (two) times daily. For a total of 14 days. 28 tablet 0  . EPINEPHrine (EPIPEN 2-PAK) 0.3 mg/0.3 mL DEVI Inject 0.3 mLs (0.3 mg total) into the muscle once. 1 Device 3  . fluticasone furoate-vilanterol (BREO ELLIPTA) 100-25 MCG/INH AEPB Inhale 1 puff into the lungs daily. 1 each 5  . meloxicam (MOBIC) 15 MG tablet Take 1 tablet (15 mg total) by mouth daily. 30 tablet 1  . methylPREDNISolone (MEDROL DOSEPAK) 4 MG TBPK tablet As directed 21 tablet 1  . promethazine-codeine (PHENERGAN WITH CODEINE) 6.25-10 MG/5ML syrup Take 5 mLs by mouth every 4 (four) hours as needed. 300 mL 0  . traMADol (ULTRAM) 50 MG tablet TAKE 1 TABLET EVERY 8 HOURS AS NEEDED 30 tablet 1  . triamcinolone cream (KENALOG) 0.5 % Use bid-qid 45 g 1  . zolpidem (AMBIEN) 10 MG tablet TAKE 1 TABLET AT BEDTIME AS NEEDED 90 tablet 1  . omega-3 acid ethyl esters (LOVAZA) 1 G capsule Take 2 capsules (2 g total) by mouth 2 (two) times daily. 120 capsule 11  . tadalafil (CIALIS) 20 MG tablet Take 1 tablet (20 mg total) by mouth daily as needed for  erectile dysfunction. 30 tablet 5   No facility-administered medications prior to visit.     ROS Review of Systems  Constitutional: Negative for appetite change, fatigue and unexpected weight change.  HENT: Negative for congestion, nosebleeds, sneezing, sore throat and trouble swallowing.   Eyes: Negative for itching and visual disturbance.  Respiratory: Negative for cough.   Cardiovascular: Negative for chest pain, palpitations and leg swelling.  Gastrointestinal: Negative for abdominal distention, blood in stool, diarrhea and nausea.  Genitourinary: Negative for frequency and hematuria.  Musculoskeletal: Negative for back pain, gait problem, joint swelling and neck pain.  Skin: Negative for rash.  Neurological: Negative for dizziness, tremors, speech difficulty and weakness.  Psychiatric/Behavioral: Negative for agitation, dysphoric mood and sleep disturbance. The patient is not nervous/anxious.     Objective:  BP 118/86 (BP Location: Left Arm, Patient Position: Sitting, Cuff Size: Large)   Pulse 84   Temp 98.6 F (37 C) (Oral)   Resp 16   Ht 6' (1.829 m)   Wt 220 lb (99.8 kg)   SpO2 97%   BMI 29.84 kg/m   BP Readings from Last 3 Encounters:  04/14/17 118/86  02/23/17 140/90  02/06/17 110/78    Wt Readings from Last 3 Encounters:  04/14/17 220 lb (99.8 kg)  02/23/17 225 lb (102.1 kg)  02/06/17 221 lb (100.2  kg)    Physical Exam  Constitutional: He is oriented to person, place, and time. He appears well-developed. No distress.  NAD  HENT:  Mouth/Throat: Oropharynx is clear and moist.  Eyes: Pupils are equal, round, and reactive to light. Conjunctivae are normal.  Neck: Normal range of motion. No JVD present. No thyromegaly present.  Cardiovascular: Normal rate, regular rhythm, normal heart sounds and intact distal pulses.  Exam reveals no gallop and no friction rub.   No murmur heard. Pulmonary/Chest: Effort normal and breath sounds normal. No respiratory  distress. He has no wheezes. He has no rales. He exhibits no tenderness.  Abdominal: Soft. Bowel sounds are normal. He exhibits no distension and no mass. There is no tenderness. There is no rebound and no guarding.  Musculoskeletal: Normal range of motion. He exhibits no edema or tenderness.  Lymphadenopathy:    He has no cervical adenopathy.  Neurological: He is alert and oriented to person, place, and time. He has normal reflexes. No cranial nerve deficit. He exhibits normal muscle tone. He displays a negative Romberg sign. Coordination and gait normal.  Skin: Skin is warm and dry. No rash noted.  Psychiatric: He has a normal mood and affect. His behavior is normal. Judgment and thought content normal.  B MCP tender in #2-3-4 area  Lab Results  Component Value Date   WBC 7.1 02/23/2017   HGB 14.5 02/23/2017   HCT 43.3 02/23/2017   PLT 275.0 02/23/2017   GLUCOSE 100 (H) 02/06/2017   CHOL 239 (H) 06/12/2015   TRIG 163.0 (H) 06/12/2015   HDL 52.20 06/12/2015   LDLDIRECT 153.3 05/13/2011   LDLCALC 154 (H) 06/12/2015   ALT 25 02/06/2017   AST 25 02/06/2017   NA 136 02/06/2017   K 3.6 02/06/2017   CL 100 02/06/2017   CREATININE 1.03 02/06/2017   BUN 16 02/06/2017   CO2 27 02/06/2017   TSH 1.78 06/12/2015   PSA 1.31 06/12/2015    Dg Chest 2 View  Result Date: 11/02/2016 CLINICAL DATA:  History of pneumonia, followup, former smoking history EXAM: CHEST  2 VIEW COMPARISON:  Chest x-ray of 11/18/2015 FINDINGS: No active infiltrate or effusion is seen. Mediastinal and hilar contours are unremarkable. Minimal linear scarring in the lingula or right middle lobe is stable. The heart is within normal limits in size. No bony abnormality is seen. IMPRESSION: No active cardiopulmonary disease. Electronically Signed   By: Ivar Drape M.D.   On: 11/02/2016 08:26    Assessment & Plan:   There are no diagnoses linked to this encounter. I am having [name] maintain his omega-3 acid ethyl  esters, EPINEPHrine, fluticasone furoate-vilanterol, benzonatate, albuterol, promethazine-codeine, tadalafil, triamcinolone cream, methylPREDNISolone, zolpidem, doxycycline, meloxicam, clobetasol ointment, and traMADol.  No orders of the defined types were placed in this encounter.    Follow-up: No Follow-up on file.  Walker Kehr, MD

## 2017-04-17 ENCOUNTER — Encounter: Payer: Self-pay | Admitting: Internal Medicine

## 2017-06-13 ENCOUNTER — Other Ambulatory Visit: Payer: Self-pay | Admitting: Internal Medicine

## 2017-06-14 NOTE — Telephone Encounter (Signed)
Check [place] registry last filled 05/16/2017 pls advise...Stephen Blankenship

## 2017-06-19 ENCOUNTER — Telehealth: Payer: Self-pay | Admitting: Internal Medicine

## 2017-06-19 NOTE — Telephone Encounter (Signed)
Copied from [place] [id number]. Topic: Quick Communication - Rx Refill/Question >> Jun 19, 2017 12:11 PM Bea Graff, NT wrote: Has the patient contacted their pharmacy? Yes.     (Agent: If no, request that the patient contact the pharmacy for the refill.)   Preferred Pharmacy (with phone number or street name): [facility] [address]  Needs refill of Ambien.  Agent: Please be advised that RX refills may take up to 3 business days. We ask that you follow-up with your pharmacy.

## 2017-06-20 NOTE — Telephone Encounter (Signed)
Looks this was filled 06/19/17 90 tabs with 1 refill

## 2017-06-22 DIAGNOSIS — A6920 Lyme disease, unspecified: Secondary | ICD-10-CM | POA: Diagnosis not present

## 2017-06-22 DIAGNOSIS — M2550 Pain in unspecified joint: Secondary | ICD-10-CM | POA: Diagnosis not present

## 2017-08-08 DIAGNOSIS — H524 Presbyopia: Secondary | ICD-10-CM | POA: Diagnosis not present

## 2017-09-10 IMAGING — DX DG CHEST 2V
2 series · 2 of 2 positions shown · non-contrast
Comparison: Chest x-ray of 11/18/2015

CLINICAL DATA: History of pneumonia, followup, former smoking
history

EXAM:
CHEST  2 VIEW

[chest pa]
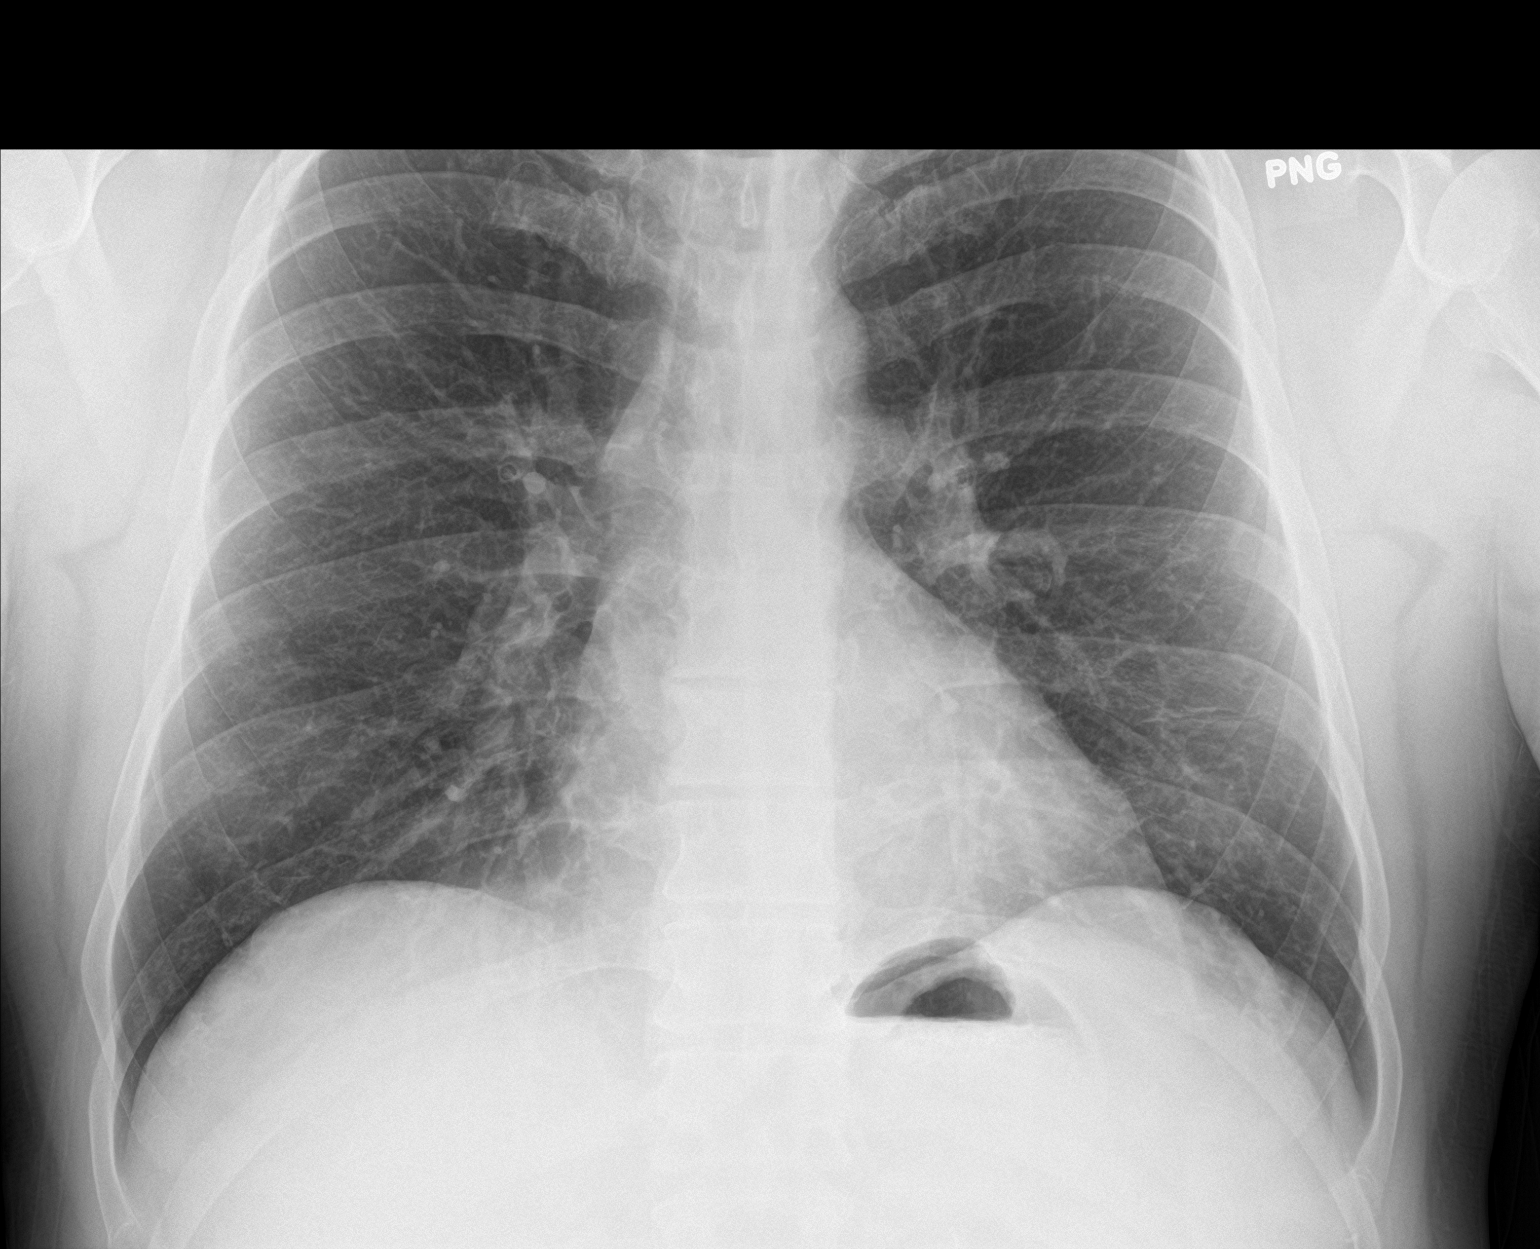

[chest lat]
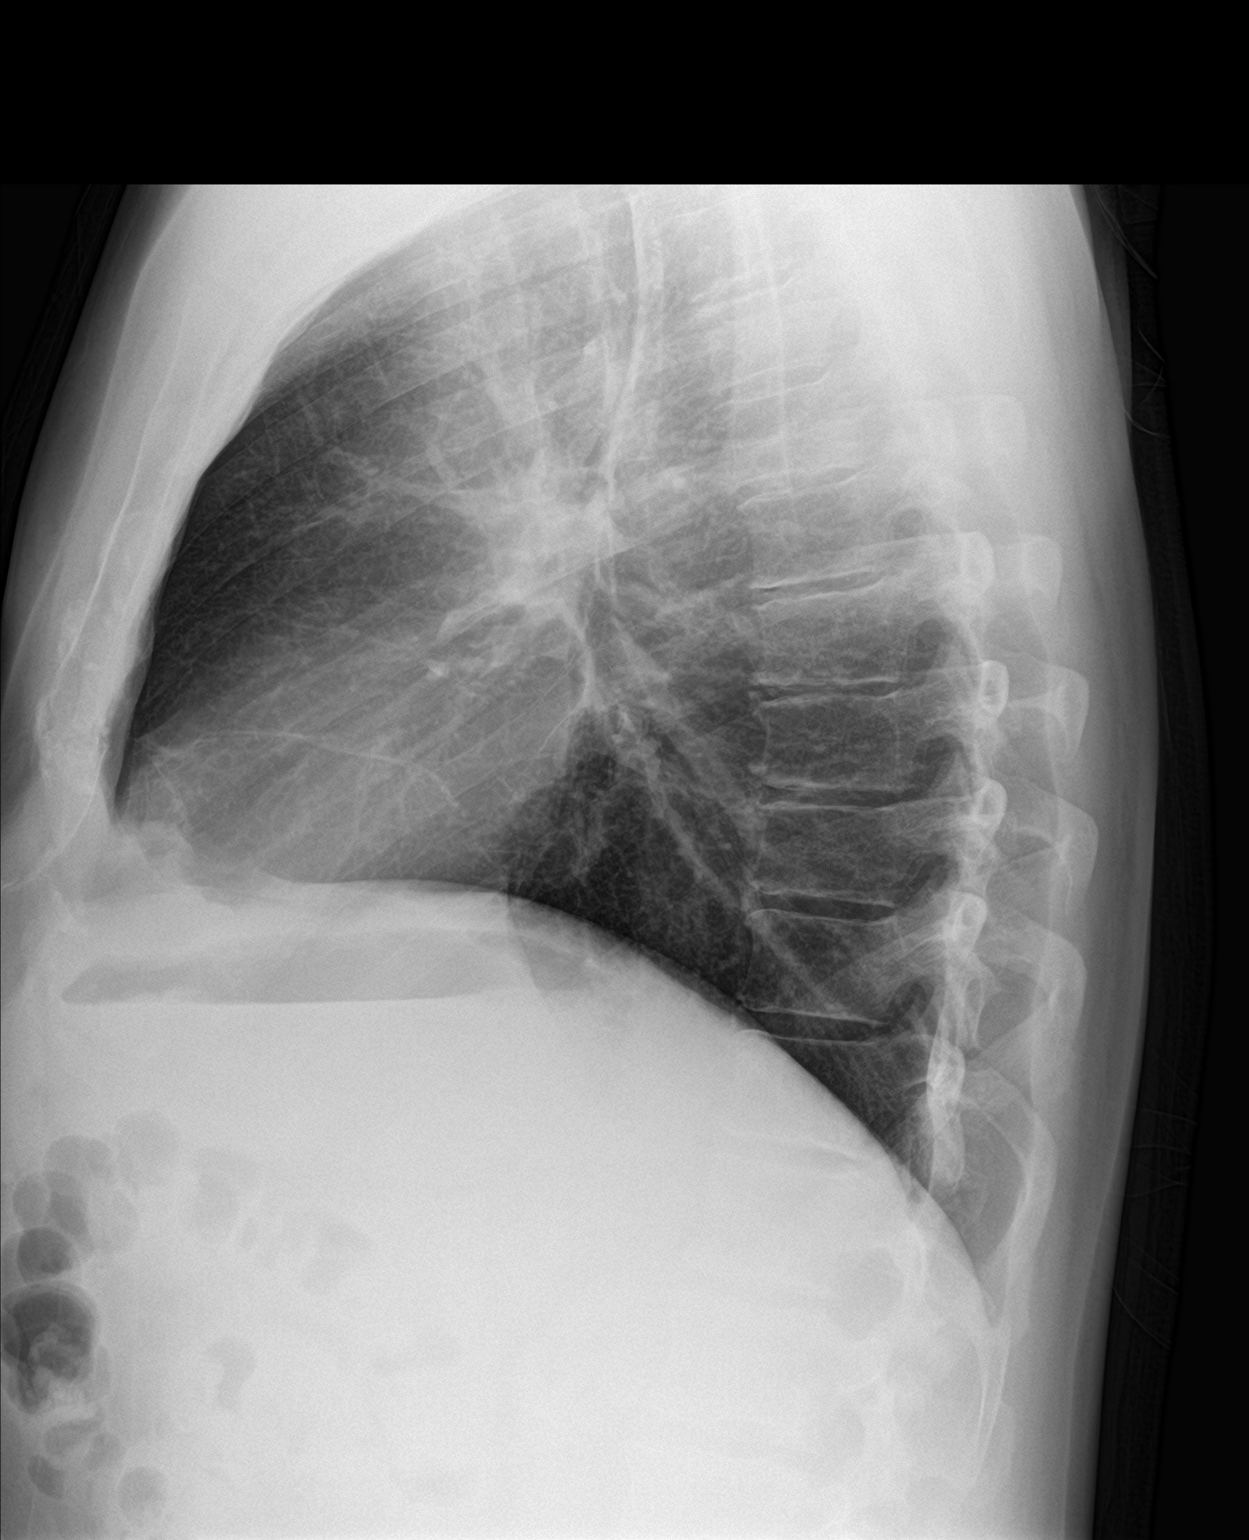

[2 of 2 positions shown; findings below may reference images not displayed]

FINDINGS: No active infiltrate or effusion is seen. Mediastinal and hilar
contours are unremarkable. Minimal linear scarring in the lingula or
right middle lobe is stable. The heart is within normal limits in
size. No bony abnormality is seen.
IMPRESSION: No active cardiopulmonary disease.

## 2017-12-12 ENCOUNTER — Other Ambulatory Visit: Payer: Self-pay | Admitting: Internal Medicine

## 2017-12-13 NOTE — Telephone Encounter (Signed)
OV q 6 mo

## 2018-01-02 ENCOUNTER — Telehealth: Payer: Self-pay | Admitting: *Deleted

## 2018-01-02 MED ORDER — EPINEPHRINE 0.3 MG/0.3ML IJ SOAJ: 0.3000 mg | Freq: Once | INTRAMUSCULAR | 0 refills | Status: AC

## 2018-01-02 NOTE — Telephone Encounter (Signed)
Copied from [place] [id number]. Topic: Quick Communication - See Telephone Encounter >> Jan 02, 2018 10:18 AM Hewitt Shorts wrote: Pt is needing a refill on his epi pen   Best number [phone]  [facility] [address]    Reviewed chart pt is up-to-date sent refills to [facility].Marland KitchenJohny Chess

## 2018-02-23 DIAGNOSIS — B360 Pityriasis versicolor: Secondary | ICD-10-CM | POA: Diagnosis not present

## 2018-02-23 DIAGNOSIS — L814 Other melanin hyperpigmentation: Secondary | ICD-10-CM | POA: Diagnosis not present

## 2018-02-23 DIAGNOSIS — D225 Melanocytic nevi of trunk: Secondary | ICD-10-CM | POA: Diagnosis not present

## 2018-06-02 ENCOUNTER — Telehealth: Payer: Self-pay | Admitting: Internal Medicine

## 2018-06-04 MED ORDER — ALBUTEROL SULFATE HFA 108 (90 BASE) MCG/ACT IN AERS: 2.0000 | INHALATION_SPRAY | Freq: Four times a day (QID) | RESPIRATORY_TRACT | 1 refills | Status: DC | PRN

## 2018-06-04 NOTE — Telephone Encounter (Signed)
Patient schedule an appt on  06-15-18 with Dr. Alain Marion, he would like a refill until the appt. Please advise

## 2018-06-04 NOTE — Addendum Note (Signed)
Addended by: Karren Cobble on: 06/04/2018 11:24 AM   Modules accepted: Orders

## 2018-06-04 NOTE — Telephone Encounter (Signed)
RX sent

## 2018-06-09 ENCOUNTER — Other Ambulatory Visit: Payer: Self-pay | Admitting: Internal Medicine

## 2018-06-11 NOTE — Telephone Encounter (Signed)
Needs OV.  

## 2018-06-15 ENCOUNTER — Ambulatory Visit: Payer: 59 | Admitting: Internal Medicine

## 2018-06-15 ENCOUNTER — Encounter: Payer: Self-pay | Admitting: Internal Medicine

## 2018-06-15 DIAGNOSIS — Z0000 Encounter for general adult medical examination without abnormal findings: Secondary | ICD-10-CM | POA: Diagnosis not present

## 2018-06-15 MED ORDER — FLUTICASONE FUROATE-VILANTEROL 100-25 MCG/INH IN AEPB: 1.0000 | INHALATION_SPRAY | Freq: Every day | RESPIRATORY_TRACT | 5 refills | Status: DC

## 2018-06-15 MED ORDER — AMOXICILLIN-POT CLAVULANATE 875-125 MG PO TABS: 1.0000 | ORAL_TABLET | Freq: Two times a day (BID) | ORAL | 0 refills | Status: DC

## 2018-06-15 MED ORDER — ZOLPIDEM TARTRATE 10 MG PO TABS: 10.0000 mg | ORAL_TABLET | Freq: Every evening | ORAL | 5 refills | Status: DC | PRN

## 2018-06-15 NOTE — Progress Notes (Signed)
Subjective:  Patient ID: Stephen Blankenship, male    DOB: 03/16/1971  Age: 47 y.o. MRN: [id number]  CC: No chief complaint on file.   HPI Stephen Blankenship presents for insomnia, asthma f/u  Outpatient Medications Prior to Visit  Medication Sig Dispense Refill  . albuterol (PROVENTIL HFA;VENTOLIN HFA) 108 (90 Base) MCG/ACT inhaler Inhale 2 puffs into the lungs every 6 (six) hours as needed for wheezing or shortness of breath. 1 Inhaler 1  . benzonatate (TESSALON) 200 MG capsule Take 1 capsule (200 mg total) by mouth 3 (three) times daily as needed for cough. 60 capsule 0  . Cholecalciferol (VITAMIN D3) 2000 units capsule Take 1 capsule (2,000 Units total) by mouth daily. 100 capsule 3  . clobetasol ointment (TEMOVATE) 0.05 % Apply topically 2 (two) times daily as needed. 60 g 3  . EPINEPHrine (EPIPEN 2-PAK) 0.3 mg/0.3 mL DEVI Inject 0.3 mLs (0.3 mg total) into the muscle once. 1 Device 3  . fluticasone furoate-vilanterol (BREO ELLIPTA) 100-25 MCG/INH AEPB Inhale 1 puff into the lungs daily. 1 each 5  . meloxicam (MOBIC) 15 MG tablet Take 1 tablet (15 mg total) by mouth daily. 30 tablet 1  . traMADol (ULTRAM) 50 MG tablet TAKE 1 TABLET EVERY 8 HOURS AS NEEDED 30 tablet 1  . triamcinolone cream (KENALOG) 0.5 % Use bid-qid 45 g 1  . zolpidem (AMBIEN) 10 MG tablet TAKE 1 TABLET AT BEDTIME AS NEEDED 30 tablet 0  . omega-3 acid ethyl esters (LOVAZA) 1 G capsule Take 2 capsules (2 g total) by mouth 2 (two) times daily. 120 capsule 11  . tadalafil (CIALIS) 20 MG tablet Take 1 tablet (20 mg total) by mouth daily as needed for erectile dysfunction. 30 tablet 5   No facility-administered medications prior to visit.     ROS: Review of Systems  Constitutional: Negative for appetite change, fatigue and unexpected weight change.  HENT: Negative for congestion, nosebleeds, sneezing, sore throat and trouble swallowing.   Eyes: Negative for itching and visual disturbance.  Respiratory: Negative for cough.     Cardiovascular: Negative for chest pain, palpitations and leg swelling.  Gastrointestinal: Negative for abdominal distention, blood in stool, diarrhea and nausea.  Genitourinary: Negative for frequency and hematuria.  Musculoskeletal: Negative for back pain, gait problem, joint swelling and neck pain.  Skin: Negative for rash.  Neurological: Negative for dizziness, tremors, speech difficulty and weakness.  Psychiatric/Behavioral: Positive for sleep disturbance. Negative for agitation, dysphoric mood and suicidal ideas. The patient is not nervous/anxious.     Objective:  BP 124/82 (BP Location: Left Arm, Patient Position: Sitting, Cuff Size: Large)   Pulse 85   Temp 98.5 F (36.9 C) (Oral)   Ht 6' (1.829 m)   Wt 226 lb (102.5 kg)   SpO2 96%   BMI 30.65 kg/m   BP Readings from Last 3 Encounters:  06/15/18 124/82  04/14/17 118/86  02/23/17 140/90    Wt Readings from Last 3 Encounters:  06/15/18 226 lb (102.5 kg)  04/14/17 220 lb (99.8 kg)  02/23/17 225 lb (102.1 kg)    Physical Exam  Constitutional: He is oriented to person, place, and time. He appears well-developed. No distress.  NAD  HENT:  Mouth/Throat: Oropharynx is clear and moist.  Eyes: Pupils are equal, round, and reactive to light. Conjunctivae are normal.  Neck: Normal range of motion. No JVD present. No thyromegaly present.  Cardiovascular: Normal rate, regular rhythm, normal heart sounds and intact distal pulses. Exam reveals  no gallop and no friction rub.  No murmur heard. Pulmonary/Chest: Effort normal and breath sounds normal. No respiratory distress. He has no wheezes. He has no rales. He exhibits no tenderness.  Abdominal: Soft. Bowel sounds are normal. He exhibits no distension and no mass. There is no tenderness. There is no rebound and no guarding.  Musculoskeletal: Normal range of motion. He exhibits no edema or tenderness.  Lymphadenopathy:    He has no cervical adenopathy.  Neurological: He is  alert and oriented to person, place, and time. He has normal reflexes. No cranial nerve deficit. He exhibits normal muscle tone. He displays a negative Romberg sign. Coordination and gait normal.  Skin: Skin is warm and dry. No rash noted.  Psychiatric: He has a normal mood and affect. His behavior is normal. Judgment and thought content normal.    Lab Results  Component Value Date   WBC 7.1 02/23/2017   HGB 14.5 02/23/2017   HCT 43.3 02/23/2017   PLT 275.0 02/23/2017   GLUCOSE 100 (H) 02/06/2017   CHOL 239 (H) 06/12/2015   TRIG 163.0 (H) 06/12/2015   HDL 52.20 06/12/2015   LDLDIRECT 153.3 05/13/2011   LDLCALC 154 (H) 06/12/2015   ALT 25 02/06/2017   AST 25 02/06/2017   NA 136 02/06/2017   K 3.6 02/06/2017   CL 100 02/06/2017   CREATININE 1.03 02/06/2017   BUN 16 02/06/2017   CO2 27 02/06/2017   TSH 1.78 06/12/2015   PSA 1.31 06/12/2015    Dg Chest 2 View  Result Date: 11/02/2016 CLINICAL DATA:  History of pneumonia, followup, former smoking history EXAM: CHEST  2 VIEW COMPARISON:  Chest x-ray of 11/18/2015 FINDINGS: No active infiltrate or effusion is seen. Mediastinal and hilar contours are unremarkable. Minimal linear scarring in the lingula or right middle lobe is stable. The heart is within normal limits in size. No bony abnormality is seen. IMPRESSION: No active cardiopulmonary disease. Electronically Signed   By: Ivar Drape M.D.   On: 11/02/2016 08:26    Assessment & Plan:   There are no diagnoses linked to this encounter.   No orders of the defined types were placed in this encounter.    Follow-up: No follow-ups on file.  Walker Kehr, MD

## 2018-06-15 NOTE — Patient Instructions (Signed)
You can use over-the-counter  "cold" medicines  such as "Tylenol cold" , "Advil cold",  "Mucinex" or" Mucinex D"  for cough and congestion.   Avoid decongestants if you have high blood pressure and use "Afrin" nasal spray for nasal congestion as directed. Use " Delsym" or" Robitussin" cough syrup varietis for cough.  You can use plain "Tylenol" or "Advil" for fever, chills and achyness. Use Halls or Ricola cough drops.   Please, make an appointment if you are not better or if you're worse.  

## 2018-06-15 NOTE — Assessment & Plan Note (Signed)
We discussed age appropriate health related issues, including available/recomended screening tests and vaccinations. We discussed a need for adhering to healthy diet and exercise. Labs/EKG were reviewed/ordered. All questions were answered.   

## 2018-06-23 ENCOUNTER — Other Ambulatory Visit: Payer: Self-pay | Admitting: Internal Medicine

## 2018-07-16 DIAGNOSIS — Z125 Encounter for screening for malignant neoplasm of prostate: Secondary | ICD-10-CM | POA: Diagnosis not present

## 2018-07-16 DIAGNOSIS — E785 Hyperlipidemia, unspecified: Secondary | ICD-10-CM | POA: Diagnosis not present

## 2018-07-16 DIAGNOSIS — Z131 Encounter for screening for diabetes mellitus: Secondary | ICD-10-CM | POA: Diagnosis not present

## 2018-07-26 ENCOUNTER — Telehealth: Payer: Self-pay | Admitting: Internal Medicine

## 2018-07-26 NOTE — Telephone Encounter (Signed)
Copied from [place] [id number]. Topic: Quick Communication - Rx Refill/Question >> Jul 26, 2018 12:16 PM Bea Graff, NT wrote: Medication: zolpidem (AMBIEN) 10 MG tablet, albuterol (PROVENTIL HFA;VENTOLIN HFA) 108 (90 Base) MCG/ACT inhaler, clobetasol ointment (TEMOVATE) 0.05 and EPINEPHrine (EPIPEN 2-PAK) 0.3 mg/0.3 mL DEVI    Pt states that these med need to be faxed to the [facility] to nurse Kearney Hard at fax#: [phone] so that they can add it for his active med list.

## 2018-08-01 MED ORDER — ZOLPIDEM TARTRATE 10 MG PO TABS: 10.0000 mg | ORAL_TABLET | Freq: Every evening | ORAL | 5 refills | Status: DC | PRN

## 2018-08-01 MED ORDER — CLOBETASOL PROPIONATE 0.05 % EX OINT: TOPICAL_OINTMENT | CUTANEOUS | 15 refills | Status: DC

## 2018-08-01 MED ORDER — EPINEPHRINE 0.15 MG/0.3ML IJ SOAJ: 0.1500 mg | INTRAMUSCULAR | 1 refills | Status: AC | PRN

## 2018-08-01 MED ORDER — ALBUTEROL SULFATE HFA 108 (90 BASE) MCG/ACT IN AERS: 2.0000 | INHALATION_SPRAY | Freq: Four times a day (QID) | RESPIRATORY_TRACT | 1 refills | Status: AC | PRN

## 2018-08-01 NOTE — Telephone Encounter (Signed)
RX's faxed.

## 2019-04-05 ENCOUNTER — Telehealth: Payer: Self-pay | Admitting: Internal Medicine

## 2019-04-05 NOTE — Telephone Encounter (Signed)
Medication refill: tadalafil (CIALIS) 20 MG tablet [id number]   Pt stated that he would like a paper RX so that he can send it to [facility].   Pt would like a call back from the nurse regarding

## 2019-04-05 NOTE — Telephone Encounter (Signed)
Pt notified. RX faxed.

## 2019-04-15 ENCOUNTER — Ambulatory Visit (INDEPENDENT_AMBULATORY_CARE_PROVIDER_SITE_OTHER): Payer: 59 | Admitting: Internal Medicine

## 2019-04-15 ENCOUNTER — Ambulatory Visit (INDEPENDENT_AMBULATORY_CARE_PROVIDER_SITE_OTHER)
Admission: RE | Admit: 2019-04-15 | Discharge: 2019-04-15 | Disposition: A | Discharge status: Home / Self Care | Payer: 59 | Source: Ambulatory Visit | Attending: Internal Medicine | Admitting: Internal Medicine

## 2019-04-15 ENCOUNTER — Other Ambulatory Visit: Payer: Self-pay

## 2019-04-15 ENCOUNTER — Encounter: Payer: Self-pay | Admitting: Internal Medicine

## 2019-04-15 VITALS — BP 130/82 | HR 94 | Temp 98.5°F | Ht 72.0 in | Wt 237.0 lb

## 2019-04-15 DIAGNOSIS — Z23 Encounter for immunization: Secondary | ICD-10-CM

## 2019-04-15 DIAGNOSIS — M545 Low back pain, unspecified: Secondary | ICD-10-CM

## 2019-04-15 DIAGNOSIS — G4700 Insomnia, unspecified: Secondary | ICD-10-CM | POA: Diagnosis not present

## 2019-04-15 MED ORDER — TRAMADOL HCL 50 MG PO TABS: 50.0000 mg | ORAL_TABLET | Freq: Four times a day (QID) | ORAL | 0 refills | Status: AC | PRN

## 2019-04-15 MED ORDER — DICLOFENAC SODIUM 75 MG PO TBEC: 75.0000 mg | DELAYED_RELEASE_TABLET | Freq: Two times a day (BID) | ORAL | 0 refills | Status: DC | PRN

## 2019-04-15 MED ORDER — TIZANIDINE HCL 4 MG PO TABS: 4.0000 mg | ORAL_TABLET | Freq: Four times a day (QID) | ORAL | 1 refills | Status: DC | PRN

## 2019-04-15 NOTE — Progress Notes (Signed)
Subjective:  Patient ID: Stephen Blankenship, male    DOB: 1971/05/03  Age: 48 y.o. MRN: [id number]  CC: No chief complaint on file.   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 Spastic, sudden onset while digging - can't sleep. Took Aleve. Worse w/coughing  Outpatient Medications Prior to Visit  Medication Sig Dispense Refill  . albuterol (PROVENTIL HFA;VENTOLIN HFA) 108 (90 Base) MCG/ACT inhaler Inhale 2 puffs into the lungs every 6 (six) hours as needed for wheezing or shortness of breath. 1 Inhaler 1  . Cholecalciferol (VITAMIN D3) 2000 units capsule Take 1 capsule (2,000 Units total) by mouth daily. 100 capsule 3  . clobetasol ointment (TEMOVATE) 0.05 % APPLY TO AFFECTED AREA TWICE A DAY AS NEEDED 15 g 15  . EPINEPHrine (EPIPEN JR 2-PAK) 0.15 MG/0.3ML injection Inject 0.3 mLs (0.15 mg total) into the muscle as needed for anaphylaxis. 2 each 1  . fluticasone furoate-vilanterol (BREO ELLIPTA) 100-25 MCG/INH AEPB Inhale 1 puff into the lungs daily. 1 each 5  . meloxicam (MOBIC) 15 MG tablet Take 1 tablet (15 mg total) by mouth daily. 30 tablet 1  . traMADol (ULTRAM) 50 MG tablet TAKE 1 TABLET EVERY 8 HOURS AS NEEDED 30 tablet 1  . triamcinolone cream (KENALOG) 0.5 % Use bid-qid 45 g 1  . zolpidem (AMBIEN) 10 MG tablet Take 1 tablet (10 mg total) by mouth at bedtime as needed. 30 tablet 5  . omega-3 acid ethyl esters (LOVAZA) 1 G capsule Take 2 capsules (2 g total) by mouth 2 (two) times daily. 120 capsule 11  . tadalafil (CIALIS) 20 MG tablet Take 1 tablet (20 mg total) by mouth daily as needed for erectile dysfunction. 30 tablet 5  . amoxicillin-clavulanate (AUGMENTIN) 875-125 MG tablet Take 1 tablet by mouth 2 (two) times daily. 20 tablet 0  . benzonatate (TESSALON) 200 MG capsule Take 1 capsule (200 mg total) by mouth 3 (three) times daily as needed for cough. 60 capsule 0   No facility-administered medications prior to visit.     ROS: Review  of Systems  Constitutional: Negative for appetite change, fatigue and unexpected weight change.  HENT: Negative for congestion, nosebleeds, sneezing, sore throat and trouble swallowing.   Eyes: Negative for itching and visual disturbance.  Respiratory: Negative for cough.   Cardiovascular: Negative for chest pain, palpitations and leg swelling.  Gastrointestinal: Negative for abdominal distention, blood in stool, diarrhea and nausea.  Genitourinary: Negative for frequency and hematuria.  Musculoskeletal: Positive for back pain and gait problem. Negative for joint swelling and neck pain.  Skin: Negative for rash.  Neurological: Negative for dizziness, tremors, speech difficulty and weakness.  Psychiatric/Behavioral: Positive for sleep disturbance. Negative for agitation, dysphoric mood and suicidal ideas. The patient is not nervous/anxious.     Objective:  BP 130/82 (BP Location: Left Arm, Patient Position: Sitting, Cuff Size: Large)   Pulse 94   Temp 98.5 F (36.9 C) (Oral)   Ht 6' (1.829 m)   Wt 237 lb (107.5 kg)   SpO2 99%   BMI 32.14 kg/m   BP Readings from Last 3 Encounters:  04/15/19 130/82  06/15/18 124/82  04/14/17 118/86    Wt Readings from Last 3 Encounters:  04/15/19 237 lb (107.5 kg)  06/15/18 226 lb (102.5 kg)  04/14/17 220 lb (99.8 kg)    Physical Exam Constitutional:      General: He is not in acute distress.  Appearance: He is well-developed.     Comments: NAD  Eyes:     Conjunctiva/sclera: Conjunctivae normal.     Pupils: Pupils are equal, round, and reactive to light.  Neck:     Musculoskeletal: Normal range of motion.     Thyroid: No thyromegaly.     Vascular: No JVD.  Cardiovascular:     Rate and Rhythm: Normal rate and regular rhythm.     Heart sounds: Normal heart sounds. No murmur. No friction rub. No gallop.   Pulmonary:     Effort: Pulmonary effort is normal. No respiratory distress.     Breath sounds: Normal breath sounds. No wheezing  or rales.  Chest:     Chest wall: No tenderness.  Abdominal:     General: Bowel sounds are normal. There is no distension.     Palpations: Abdomen is soft. There is no mass.     Tenderness: There is no abdominal tenderness. There is no guarding or rebound.  Musculoskeletal: Normal range of motion.        General: Tenderness present.  Lymphadenopathy:     Cervical: No cervical adenopathy.  Skin:    General: Skin is warm and dry.     Findings: No rash.  Neurological:     Mental Status: He is alert and oriented to person, place, and time.     Cranial Nerves: No cranial nerve deficit.     Motor: No abnormal muscle tone.     Coordination: Coordination normal.     Gait: Gait normal.     Deep Tendon Reflexes: Reflexes are normal and symmetric.  Psychiatric:        Behavior: Behavior normal.        Thought Content: Thought content normal.        Judgment: Judgment normal.   LS painful  Lab Results  Component Value Date   WBC 7.1 02/23/2017   HGB 14.5 02/23/2017   HCT 43.3 02/23/2017   PLT 275.0 02/23/2017   GLUCOSE 100 (H) 02/06/2017   CHOL 239 (H) 06/12/2015   TRIG 163.0 (H) 06/12/2015   HDL 52.20 06/12/2015   LDLDIRECT 153.3 05/13/2011   LDLCALC 154 (H) 06/12/2015   ALT 25 02/06/2017   AST 25 02/06/2017   NA 136 02/06/2017   K 3.6 02/06/2017   CL 100 02/06/2017   CREATININE 1.03 02/06/2017   BUN 16 02/06/2017   CO2 27 02/06/2017   TSH 1.78 06/12/2015   PSA 1.31 06/12/2015    Dg Chest 2 View  Result Date: 11/02/2016 CLINICAL DATA:  History of pneumonia, followup, former smoking history EXAM: CHEST  2 VIEW COMPARISON:  Chest x-ray of 11/18/2015 FINDINGS: No active infiltrate or effusion is seen. Mediastinal and hilar contours are unremarkable. Minimal linear scarring in the lingula or right middle lobe is stable. The heart is within normal limits in size. No bony abnormality is seen. IMPRESSION: No active cardiopulmonary disease. Electronically Signed   By: Ivar Drape  M.D.   On: 11/02/2016 08:26    Assessment & Plan:   There are no diagnoses linked to this encounter.   No orders of the defined types were placed in this encounter.    Follow-up: No follow-ups on file.  Walker Kehr, MD

## 2019-04-15 NOTE — Addendum Note (Signed)
Addended by: Karren Cobble on: 04/15/2019 10:46 AM   Modules accepted: Orders

## 2019-04-15 NOTE — Patient Instructions (Signed)

## 2019-04-15 NOTE — Assessment & Plan Note (Signed)
Zolpidem

## 2019-04-26 ENCOUNTER — Telehealth: Payer: Self-pay

## 2019-04-26 ENCOUNTER — Other Ambulatory Visit: Payer: Self-pay | Admitting: Family

## 2019-04-26 NOTE — Telephone Encounter (Signed)
Copied from [place] [id number]. Topic: General - Other >> Apr 26, 2019  9:46 AM Carolyn Stare wrote: Pt call to say he was suppose to have a note written by Dr Alain Marion saying it was ok for him to return back on Monday 04/29/2019  Routing to [place], can you please look at this and see if you are ok with doing letter for patient?  thanks

## 2019-04-26 NOTE — Telephone Encounter (Signed)
I don't know if you were planning to call him back regardless. I sent the note through [place]. If FMLA is required since he was out for 2 weeks though, Dr. Alain Marion will have to complete the forms for him.

## 2019-04-26 NOTE — Telephone Encounter (Signed)
Patient did see letter, that's the only thing he needs to return to work, no further action needed

## 2019-05-08 ENCOUNTER — Other Ambulatory Visit: Payer: Self-pay | Admitting: Internal Medicine

## 2019-06-29 ENCOUNTER — Other Ambulatory Visit: Payer: Self-pay | Admitting: Internal Medicine

## 2019-07-16 ENCOUNTER — Other Ambulatory Visit: Payer: Self-pay

## 2019-07-16 ENCOUNTER — Other Ambulatory Visit: Payer: Self-pay | Admitting: Internal Medicine

## 2019-07-16 MED ORDER — TADALAFIL 20 MG PO TABS: 20.0000 mg | ORAL_TABLET | Freq: Every day | ORAL | 5 refills | Status: DC | PRN

## 2019-08-05 ENCOUNTER — Other Ambulatory Visit: Payer: Self-pay | Admitting: Internal Medicine

## 2019-09-20 ENCOUNTER — Other Ambulatory Visit: Payer: Self-pay

## 2019-09-22 MED ORDER — DICLOFENAC SODIUM 75 MG PO TBEC: DELAYED_RELEASE_TABLET | ORAL | 1 refills | Status: DC

## 2020-01-14 ENCOUNTER — Encounter: Payer: Self-pay | Admitting: Family

## 2020-01-14 ENCOUNTER — Telehealth (INDEPENDENT_AMBULATORY_CARE_PROVIDER_SITE_OTHER): Payer: 59 | Admitting: Family

## 2020-01-14 DIAGNOSIS — R05 Cough: Secondary | ICD-10-CM

## 2020-01-14 DIAGNOSIS — R059 Cough, unspecified: Secondary | ICD-10-CM

## 2020-01-14 DIAGNOSIS — J4520 Mild intermittent asthma, uncomplicated: Secondary | ICD-10-CM

## 2020-01-14 MED ORDER — HYDROCODONE-HOMATROPINE 5-1.5 MG/5ML PO SYRP: 5.0000 mL | ORAL_SOLUTION | Freq: Three times a day (TID) | ORAL | 0 refills | Status: DC | PRN

## 2020-01-14 MED ORDER — AZITHROMYCIN 250 MG PO TABS: ORAL_TABLET | ORAL | 0 refills | Status: DC

## 2020-01-14 NOTE — Progress Notes (Signed)
Stephen Blankenship is a 49 y.o. male with the following history as recorded in [employer]:  Patient Active Problem List   Diagnosis Date Noted  . Hand arthritis 04/14/2017  . Febrile illness, acute 02/26/2017  . Myalgia 02/06/2017  . Contact dermatitis 11/01/2016  . CAP (community acquired pneumonia) 07/15/2016  . Acute pharyngitis 06/12/2015  . Patient exposure to body fluids 11/06/2013  . Erectile dysfunction 07/09/2013  . Stress and adjustment reaction 07/09/2013  . Low testosterone 07/29/2012  . Neoplasm of uncertain behavior of skin 07/25/2012  . Well adult exam 05/19/2011  . PNEUMONIA 06/08/2010  . Asthmatic bronchitis 06/08/2010  . Cough 06/08/2010  . INSOMNIA, CHRONIC 07/17/2009  . Dyslipidemia 12/12/2007  . TOBACCO USE DISORDER/SMOKER-SMOKING CESSATION DISCUSSED 12/12/2007  . ELBOW PAIN 12/12/2007    Current Outpatient Medications  Medication Sig Dispense Refill  . albuterol (PROVENTIL HFA;VENTOLIN HFA) 108 (90 Base) MCG/ACT inhaler Inhale 2 puffs into the lungs every 6 (six) hours as needed for wheezing or shortness of breath. 1 Inhaler 1  . azithromycin (ZITHROMAX) 250 MG tablet 2 tabs po qd x 1 day; 1 tablet per day x 4 days; 6 tablet 0  . Cholecalciferol (VITAMIN D3) 2000 units capsule Take 1 capsule (2,000 Units total) by mouth daily. 100 capsule 3  . clobetasol ointment (TEMOVATE) 0.05 % APPLY TO AFFECTED AREA TWICE A DAY AS NEEDED 15 g 15  . diclofenac (VOLTAREN) 75 MG EC tablet TAKE 1 TABLET BY MOUTH 2 (TWO) TIMES DAILY AS NEEDED. 60 tablet 1  . EPINEPHrine (EPIPEN JR 2-PAK) 0.15 MG/0.3ML injection Inject 0.3 mLs (0.15 mg total) into the muscle as needed for anaphylaxis. 2 each 1  . fluticasone furoate-vilanterol (BREO ELLIPTA) 100-25 MCG/INH AEPB Inhale 1 puff into the lungs daily. (Patient not taking: Reported on 01/14/2020) 1 each 5  . HYDROcodone-homatropine (HYCODAN) 5-1.5 MG/5ML syrup Take 5 mLs by mouth every 8 (eight) hours as needed for cough. 120 mL 0  . meloxicam  (MOBIC) 15 MG tablet Take 1 tablet (15 mg total) by mouth daily. 30 tablet 1  . omega-3 acid ethyl esters (LOVAZA) 1 G capsule Take 2 capsules (2 g total) by mouth 2 (two) times daily. 120 capsule 11  . tadalafil (CIALIS) 20 MG tablet Take 1 tablet (20 mg total) by mouth daily as needed for erectile dysfunction. 30 tablet 5  . tiZANidine (ZANAFLEX) 4 MG tablet Take 1 tablet (4 mg total) by mouth every 6 (six) hours as needed for muscle spasms. 30 tablet 1  . triamcinolone cream (KENALOG) 0.5 % Use bid-qid 45 g 1  . zolpidem (AMBIEN) 10 MG tablet TAKE 1 TABLET (10 MG TOTAL) BY MOUTH AT BEDTIME AS NEEDED. 30 tablet 2   No current facility-administered medications for this visit.    Allergies: Rosuvastatin  Past Medical History:  Diagnosis Date  . Allergy to bee sting   . Asthma   . Ear drum perforation    left  . History of kidney stones    has urologist  . Hyperlipidemia   . Right knee injury     Past Surgical History:  Procedure Laterality Date  . EYE SURGERY    . TYMPANOPLASTY  2011   Left    Family History  Problem Relation Age of Onset  . Hyperlipidemia Other   . Coronary artery disease Neg Hx     Social History   Tobacco Use  . Smoking status: Former Smoker    Quit date: 03/15/2011    Years since  quitting: 8.8  . Smokeless tobacco: Current User  Substance Use Topics  . Alcohol use: Yes    Alcohol/week: 3.0 standard drinks    Types: 3 Glasses of wine per week    Subjective:   I connected with Stephen Blankenship on 01/14/20 at  9:00 AM EDT by a video enabled telemedicine application and verified that I am speaking with the correct person using two identifiers.   I discussed the limitations of evaluation and management by telemedicine and the availability of in person appointments. The patient expressed understanding and agreed to proceed. Provider in office/ patient is at home; provider and patient are only 2 people on video call.   Cough/ congestion x 2-3 days; feels  that cough is keeping him awake; + seasonal allergies; does have underlying asthma- having to use albuterol inhaler; + headache; + up to date on COVID vaccine;     Objective:  There were no vitals filed for this visit.  General: Well developed, well nourished, in no acute distress  Head: Normocephalic and atraumatic  Lungs: Respirations unlabored;  Neurologic: Alert and oriented; speech intact;   Assessment:  1. Mild intermittent asthmatic bronchitis without complication   2. Cough     Plan:  Suspect allergy source; will go ahead and treat with Z-pak and Hycodan; he will continue using his albuterol inhaler; he understands to get a COVID test in 24-48 hours if symptoms persist even though he is fully vaccinated;   No follow-ups on file.  No orders of the defined types were placed in this encounter.   Requested Prescriptions   Signed Prescriptions Disp Refills  . azithromycin (ZITHROMAX) 250 MG tablet 6 tablet 0    Sig: 2 tabs po qd x 1 day; 1 tablet per day x 4 days;  . HYDROcodone-homatropine (HYCODAN) 5-1.5 MG/5ML syrup 120 mL 0    Sig: Take 5 mLs by mouth every 8 (eight) hours as needed for cough.

## 2020-01-16 ENCOUNTER — Encounter: Payer: Self-pay | Admitting: Internal Medicine

## 2020-01-16 ENCOUNTER — Encounter: Payer: Self-pay | Admitting: Family

## 2020-01-16 ENCOUNTER — Ambulatory Visit (INDEPENDENT_AMBULATORY_CARE_PROVIDER_SITE_OTHER): Payer: 59 | Admitting: Internal Medicine

## 2020-01-16 DIAGNOSIS — J4520 Mild intermittent asthma, uncomplicated: Secondary | ICD-10-CM | POA: Diagnosis not present

## 2020-01-16 DIAGNOSIS — J189 Pneumonia, unspecified organism: Secondary | ICD-10-CM | POA: Diagnosis not present

## 2020-01-16 MED ORDER — PROMETHAZINE-CODEINE 6.25-10 MG/5ML PO SYRP: 5.0000 mL | ORAL_SOLUTION | ORAL | 0 refills | Status: DC | PRN

## 2020-01-16 MED ORDER — METHYLPREDNISOLONE 4 MG PO TBPK: ORAL_TABLET | ORAL | 0 refills | Status: DC

## 2020-01-16 MED ORDER — CEFUROXIME AXETIL 500 MG PO TABS: 500.0000 mg | ORAL_TABLET | Freq: Two times a day (BID) | ORAL | 0 refills | Status: DC

## 2020-01-16 NOTE — Progress Notes (Signed)
Virtual Visit via Telephone Note  I connected with Stephen Blankenship on 01/16/20 at  1:20 PM EDT by telephone and verified that I am speaking with the correct person using two identifiers.   I discussed the limitations, risks, security and privacy concerns of performing an evaluation and management service by telephone and the availability of in person appointments. I also discussed with the patient that there may be a patient responsible charge related to this service. The patient expressed understanding and agreed to proceed.   History of Present Illness: Stephen Blankenship is complaining of severe cough, chest tightness, congestion.  Tussionex kept him awake overnight on 7/6.  He felt real bad on 7/6 with shortness of breath and wheezing, he may be a little better now.  In general he feels like how he did with a pneumonia he had in the past.  His recent COVID-19 test was negative.  He has retired.  He is seeing doctors at the [place].   Observations/Objective:  He sounds normal on the phone.  Not dyspneic at present Assessment and Plan:  See plan Follow Up Instructions:    I discussed the assessment and treatment plan with the patient. The patient was provided an opportunity to ask questions and all were answered. The patient agreed with the plan and demonstrated an understanding of the instructions.   The patient was advised to call back or seek an in-person evaluation if the symptoms worsen or if the condition fails to improve as anticipated.  I provided 12 minutes of non-face-to-face time during this encounter.   Walker Kehr, MD

## 2020-01-16 NOTE — Assessment & Plan Note (Addendum)
Possible Added Ceftin x 10 d To ER if worse for a CXR and labs

## 2020-01-16 NOTE — Assessment & Plan Note (Addendum)
Worse Medrol pack Prom-cod syr Cont MDI prn

## 2020-05-21 ENCOUNTER — Telehealth: Payer: Self-pay | Admitting: Internal Medicine

## 2020-05-21 DIAGNOSIS — Z0000 Encounter for general adult medical examination without abnormal findings: Secondary | ICD-10-CM

## 2020-05-21 DIAGNOSIS — Z125 Encounter for screening for malignant neoplasm of prostate: Secondary | ICD-10-CM

## 2020-05-21 NOTE — Telephone Encounter (Signed)
Pt is coming in for annual CPX. Ordered standard cpx labs.. sent msg via mychart.[name]

## 2020-05-21 NOTE — Telephone Encounter (Signed)
Patient wondering if he can get his labs done before his visit on 11.16.21  If possible let him know on mychart so he can know to go get those done.

## 2020-05-25 ENCOUNTER — Other Ambulatory Visit (INDEPENDENT_AMBULATORY_CARE_PROVIDER_SITE_OTHER): Payer: 59

## 2020-05-25 DIAGNOSIS — Z0000 Encounter for general adult medical examination without abnormal findings: Secondary | ICD-10-CM

## 2020-05-25 DIAGNOSIS — Z125 Encounter for screening for malignant neoplasm of prostate: Secondary | ICD-10-CM | POA: Diagnosis not present

## 2020-05-25 LAB — URINALYSIS, ROUTINE W REFLEX MICROSCOPIC
Bilirubin Urine: NEGATIVE
Hgb urine dipstick: NEGATIVE
Ketones, ur: NEGATIVE
Leukocytes,Ua: NEGATIVE
Nitrite: NEGATIVE
RBC / HPF: NONE SEEN (ref 0–?)
Specific Gravity, Urine: >=1.03 — AB (ref 1.000–1.030)
Total Protein, Urine: NEGATIVE
Urine Glucose: NEGATIVE
Urobilinogen, UA: 0.2 (ref 0.0–1.0)
pH: 5.5 (ref 5.0–8.0)

## 2020-05-25 LAB — CBC WITH DIFFERENTIAL/PLATELET
Basophils Absolute: 0 10*3/uL (ref 0.0–0.1)
Basophils Relative: 0.5 % (ref 0.0–3.0)
Eosinophils Absolute: 0.2 10*3/uL (ref 0.0–0.7)
Eosinophils Relative: 3 % (ref 0.0–5.0)
HCT: 44.7 % (ref 39.0–52.0)
Hemoglobin: 15.1 g/dL (ref 13.0–17.0)
Lymphocytes Relative: 28.2 % (ref 12.0–46.0)
Lymphs Abs: 2.1 10*3/uL (ref 0.7–4.0)
MCHC: 33.7 g/dL (ref 30.0–36.0)
MCV: 86.9 fl (ref 78.0–100.0)
Monocytes Absolute: 0.7 10*3/uL (ref 0.1–1.0)
Monocytes Relative: 9.8 % (ref 3.0–12.0)
Neutro Abs: 4.3 10*3/uL (ref 1.4–7.7)
Neutrophils Relative %: 58.5 % (ref 43.0–77.0)
Platelets: 244 10*3/uL (ref 150.0–400.0)
RBC: 5.15 Mil/uL (ref 4.22–5.81)
RDW: 13.1 % (ref 11.5–15.5)
WBC: 7.3 10*3/uL (ref 4.0–10.5)

## 2020-05-25 LAB — BASIC METABOLIC PANEL
BUN: 21 mg/dL (ref 6–23)
CO2: 27 mEq/L (ref 19–32)
Calcium: 9.1 mg/dL (ref 8.4–10.5)
Chloride: 104 mEq/L (ref 96–112)
Creatinine, Ser: 0.96 mg/dL (ref 0.40–1.50)
GFR: 92.11 mL/min (ref >60.00)
Glucose, Bld: 99 mg/dL (ref 70–99)
Potassium: 3.9 mEq/L (ref 3.5–5.1)
Sodium: 140 mEq/L (ref 135–145)

## 2020-05-25 LAB — LIPID PANEL
Cholesterol: 212 mg/dL — ABNORMAL HIGH (ref 0–200)
HDL: 48.8 mg/dL (ref >39.00)
NonHDL: 162.84
Total CHOL/HDL Ratio: 4
Triglycerides: 249 mg/dL — ABNORMAL HIGH (ref 0.0–149.0)
VLDL: 49.8 mg/dL — ABNORMAL HIGH (ref 0.0–40.0)

## 2020-05-25 LAB — TSH: TSH: 1.44 u[IU]/mL (ref 0.35–4.50)

## 2020-05-25 LAB — HEPATIC FUNCTION PANEL
ALT: 34 U/L (ref 0–53)
AST: 21 U/L (ref 0–37)
Albumin: 4.4 g/dL (ref 3.5–5.2)
Alkaline Phosphatase: 46 U/L (ref 39–117)
Bilirubin, Direct: 0.1 mg/dL (ref 0.0–0.3)
Total Bilirubin: 0.4 mg/dL (ref 0.2–1.2)
Total Protein: 7.1 g/dL (ref 6.0–8.3)

## 2020-05-25 LAB — LDL CHOLESTEROL, DIRECT: Direct LDL: 134 mg/dL

## 2020-05-25 LAB — PSA: PSA: 1.07 ng/mL (ref 0.10–4.00)

## 2020-05-26 ENCOUNTER — Encounter: Payer: Self-pay | Admitting: Internal Medicine

## 2020-05-26 ENCOUNTER — Ambulatory Visit: Payer: 59 | Admitting: Internal Medicine

## 2020-05-26 ENCOUNTER — Other Ambulatory Visit: Payer: Self-pay

## 2020-05-26 ENCOUNTER — Ambulatory Visit (INDEPENDENT_AMBULATORY_CARE_PROVIDER_SITE_OTHER): Payer: 59 | Admitting: Internal Medicine

## 2020-05-26 VITALS — BP 112/80 | HR 77 | Temp 98.6°F | Ht 72.0 in | Wt 237.4 lb

## 2020-05-26 DIAGNOSIS — Z0000 Encounter for general adult medical examination without abnormal findings: Secondary | ICD-10-CM | POA: Diagnosis not present

## 2020-05-26 DIAGNOSIS — G5600 Carpal tunnel syndrome, unspecified upper limb: Secondary | ICD-10-CM | POA: Insufficient documentation

## 2020-05-26 DIAGNOSIS — E785 Hyperlipidemia, unspecified: Secondary | ICD-10-CM

## 2020-05-26 DIAGNOSIS — K760 Fatty (change of) liver, not elsewhere classified: Secondary | ICD-10-CM | POA: Insufficient documentation

## 2020-05-26 DIAGNOSIS — G5603 Carpal tunnel syndrome, bilateral upper limbs: Secondary | ICD-10-CM

## 2020-05-26 NOTE — Assessment & Plan Note (Addendum)
  We discussed age appropriate health related issues, including available/recomended screening tests and vaccinations. Labs were ordered to be later reviewed . All questions were answered. We discussed one or more of the following - seat belt use, use of sunscreen/sun exposure exercise, safe sex, fall risk reduction, second hand smoke exposure, firearm use and storage, seat belt use, a need for adhering to healthy diet and exercise. Labs were ordered.  All questions were answered. Colon nl at [facility]- 2021 A cardiac CT scan for coronary calcium ordered

## 2020-05-26 NOTE — Patient Instructions (Addendum)
Cardiac CT calcium scoring test $150 Tel # is [phone]   Computed tomography, more commonly known as a CT or CAT scan, is a diagnostic medical imaging test. Like traditional x-rays, it produces multiple images or pictures of the inside of the body. The cross-sectional images generated during a CT scan can be reformatted in multiple planes. They can even generate three-dimensional images. These images can be viewed on a computer monitor, printed on film or by a 3D printer, or transferred to a CD or DVD. CT images of internal organs, bones, soft tissue and blood vessels provide greater detail than traditional x-rays, particularly of soft tissues and blood vessels. A cardiac CT scan for coronary calcium is a non-invasive way of obtaining information about the presence, location and extent of calcified plaque in the coronary arteries--the vessels that supply oxygen-containing blood to the heart muscle. Calcified plaque results when there is a build-up of fat and other substances under the inner layer of the artery. This material can calcify which signals the presence of atherosclerosis, a disease of the vessel wall, also called coronary artery disease (CAD). People with this disease have an increased risk for heart attacks. In addition, over time, progression of plaque build up (CAD) can narrow the arteries or even close off blood flow to the heart. The result may be chest pain, sometimes called "angina," or a heart attack. Because calcium is a marker of CAD, the amount of calcium detected on a cardiac CT scan is a helpful prognostic tool. The findings on cardiac CT are expressed as a calcium score. Another name for this test is coronary artery calcium scoring.  What are some common uses of the procedure? The goal of cardiac CT scan for calcium scoring is to determine if CAD is present and to what extent, even if there are no symptoms. It is a screening study that may be recommended by a physician for  patients with risk factors for CAD but no clinical symptoms. The major risk factors for CAD are: . high blood cholesterol levels  . family history of heart attacks  . diabetes  . high blood pressure  . cigarette smoking  . overweight or obese  . physical inactivity   A negative cardiac CT scan for calcium scoring shows no calcification within the coronary arteries. This suggests that CAD is absent or so minimal it cannot be seen by this technique. The chance of having a heart attack over the next two to five years is very low under these circumstances. A positive test means that CAD is present, regardless of whether or not the patient is experiencing any symptoms. The amount of calcification--expressed as the calcium score--may help to predict the likelihood of a myocardial infarction (heart attack) in the coming years and helps your medical doctor or cardiologist decide whether the patient may need to take preventive medicine or undertake other measures such as diet and exercise to lower the risk for heart attack. The extent of CAD is graded according to your calcium score:  Calcium Score  Presence of CAD (coronary artery disease)  0 No evidence of CAD   1-10 Minimal evidence of CAD  11-100 Mild evidence of CAD  101-400 Moderate evidence of CAD  Over 400 Extensive evidence of CAD    Carpal Tunnel Syndrome  Carpal tunnel syndrome is a condition that causes pain in your hand and arm. The carpal tunnel is a narrow area that is on the palm side of your wrist. Repeated wrist motion  or certain diseases may cause swelling in the tunnel. This swelling can pinch the main nerve in the wrist (median nerve). What are the causes? This condition may be caused by:  Repeated wrist motions.  Wrist injuries.  Arthritis.  A sac of fluid (cyst) or abnormal growth (tumor) in the carpal tunnel.  Fluid buildup during pregnancy. Sometimes the cause is not known. What increases the risk? The  following factors may make you more likely to develop this condition:  Having a job in which you move your wrist in the same way many times. This includes jobs like being a Software engineer or a Scientist, water quality.  Being a woman.  Having other health conditions, such as: ? Diabetes. ? Obesity. ? A thyroid gland that is not active enough (hypothyroidism). ? Kidney failure. What are the signs or symptoms? Symptoms of this condition include:  A tingling feeling in your fingers.  Tingling or a loss of feeling (numbness) in your hand.  Pain in your entire arm. This pain may get worse when you bend your wrist and elbow for a long time.  Pain in your wrist that goes up your arm to your shoulder.  Pain that goes down into your palm or fingers.  A weak feeling in your hands. You may find it hard to grab and hold items. You may feel worse at night. How is this diagnosed? This condition is diagnosed with a medical history and physical exam. You may also have tests, such as:  Electromyogram (EMG). This test checks the signals that the nerves send to the muscles.  Nerve conduction study. This test checks how well signals pass through your nerves.  Imaging tests, such as X-rays, ultrasound, and MRI. These tests check for what might be the cause of your condition. How is this treated? This condition may be treated with:  Lifestyle changes. You will be asked to stop or change the activity that caused your problem.  Doing exercise and activities that make bones and muscles stronger (physical therapy).  Learning how to use your hand again (occupational therapy).  Medicines for pain and swelling (inflammation). You may have injections in your wrist.  A wrist splint.  Surgery. Follow these instructions at home: If you have a splint:  Wear the splint as told by your doctor. Remove it only as told by your doctor.  Loosen the splint if your fingers: ? Tingle. ? Lose feeling (become numb). ? Turn cold  and blue.  Keep the splint clean.  If the splint is not waterproof: ? Do not let it get wet. ? Cover it with a watertight covering when you take a bath or a shower. Managing pain, stiffness, and swelling   If told, put ice on the painful area: ? If you have a removable splint, remove it as told by your doctor. ? Put ice in a plastic bag. ? Place a towel between your skin and the bag. ? Leave the ice on for 20 minutes, 2-3 times per day. General instructions  Take over-the-counter and prescription medicines only as told by your doctor.  Rest your wrist from any activity that may cause pain. If needed, talk with your boss at work about changes that can help your wrist heal.  Do any exercises as told by your doctor, physical therapist, or occupational therapist.  Keep all follow-up visits as told by your doctor. This is important. Contact a doctor if:  You have new symptoms.  Medicine does not help your pain.  Your  symptoms get worse. Get help right away if:  You have very bad numbness or tingling in your wrist or hand. Summary  Carpal tunnel syndrome is a condition that causes pain in your hand and arm.  It is often caused by repeated wrist motions.  Lifestyle changes and medicines are used to treat this problem. Surgery may help in very bad cases.  Follow your doctor's instructions about wearing a splint, resting your wrist, keeping follow-up visits, and calling for help. This information is not intended to replace advice given to you by your health care provider. Make sure you discuss any questions you have with your health care provider. Document Revised: 11/03/2017 Document Reviewed: 11/03/2017 Elsevier Patient Education  [place].

## 2020-05-26 NOTE — Progress Notes (Signed)
Subjective:  Patient ID: Stephen Blankenship, male    DOB: Nov 29, 1970  Age: 49 y.o. MRN: [id number]  CC: Annual Exam (Flu shot)   HPI Stephen Blankenship presents for a well exam  He has retired in 2021.  He is seeing doctors at the [place]. Building a Radio broadcast assistant interior Fatty liver per [employer] [facility] now  Outpatient Medications Prior to Visit  Medication Sig Dispense Refill  . albuterol (PROVENTIL HFA;VENTOLIN HFA) 108 (90 Base) MCG/ACT inhaler Inhale 2 puffs into the lungs every 6 (six) hours as needed for wheezing or shortness of breath. 1 Inhaler 1  . EPINEPHrine (EPIPEN JR 2-PAK) 0.15 MG/0.3ML injection Inject 0.3 mLs (0.15 mg total) into the muscle as needed for anaphylaxis. 2 each 1  . fluticasone furoate-vilanterol (BREO ELLIPTA) 100-25 MCG/INH AEPB Inhale 1 puff into the lungs daily. (Patient not taking: Reported on 01/14/2020) 1 each 5  . meloxicam (MOBIC) 15 MG tablet Take 1 tablet (15 mg total) by mouth daily. 30 tablet 1  . methylPREDNISolone (MEDROL DOSEPAK) 4 MG TBPK tablet As directed 21 tablet 0  . omega-3 acid ethyl esters (LOVAZA) 1 G capsule Take 2 capsules (2 g total) by mouth 2 (two) times daily. 120 capsule 11  . promethazine-codeine (PHENERGAN WITH CODEINE) 6.25-10 MG/5ML syrup Take 5 mLs by mouth every 4 (four) hours as needed. 300 mL 0  . tadalafil (CIALIS) 20 MG tablet Take 1 tablet (20 mg total) by mouth daily as needed for erectile dysfunction. 30 tablet 5  . tiZANidine (ZANAFLEX) 4 MG tablet Take 1 tablet (4 mg total) by mouth every 6 (six) hours as needed for muscle spasms. 30 tablet 1  . triamcinolone cream (KENALOG) 0.5 % Use bid-qid 45 g 1  . zolpidem (AMBIEN) 10 MG tablet TAKE 1 TABLET (10 MG TOTAL) BY MOUTH AT BEDTIME AS NEEDED. 30 tablet 2  . azithromycin (ZITHROMAX) 250 MG tablet 2 tabs po qd x 1 day; 1 tablet per day x 4 days; 6 tablet 0  . cefUROXime (CEFTIN) 500 MG tablet Take 1 tablet (500 mg total) by mouth 2 (two) times daily. 20 tablet 0  .  Cholecalciferol (VITAMIN D3) 2000 units capsule Take 1 capsule (2,000 Units total) by mouth daily. 100 capsule 3  . clobetasol ointment (TEMOVATE) 0.05 % APPLY TO AFFECTED AREA TWICE A DAY AS NEEDED 15 g 15  . diclofenac (VOLTAREN) 75 MG EC tablet TAKE 1 TABLET BY MOUTH 2 (TWO) TIMES DAILY AS NEEDED. 60 tablet 1   No facility-administered medications prior to visit.    ROS: Review of Systems  Constitutional: Negative for appetite change, fatigue and unexpected weight change.  HENT: Negative for congestion, nosebleeds, sneezing, sore throat and trouble swallowing.   Eyes: Negative for itching and visual disturbance.  Respiratory: Negative for cough.   Cardiovascular: Negative for chest pain, palpitations and leg swelling.  Gastrointestinal: Negative for abdominal distention, blood in stool, diarrhea and nausea.  Genitourinary: Negative for frequency and hematuria.  Musculoskeletal: Positive for arthralgias. Negative for back pain, gait problem, joint swelling and neck pain.  Skin: Negative for rash.  Neurological: Positive for numbness. Negative for dizziness, tremors, speech difficulty and weakness.  Psychiatric/Behavioral: Negative for agitation, dysphoric mood, sleep disturbance and suicidal ideas. The patient is not nervous/anxious.     Objective:  BP 112/80 (BP Location: Left Arm)   Pulse 77   Temp 98.6 F (37 C) (Oral)   Ht 6' (1.829 m)   [facility]  237 lb 6.4 oz (107.7 kg)   SpO2 97%   BMI 32.20 kg/m   BP Readings from Last 3 Encounters:  05/26/20 112/80  04/15/19 130/82  06/15/18 124/82    Wt Readings from Last 3 Encounters:  05/26/20 237 lb 6.4 oz (107.7 kg)  04/15/19 237 lb (107.5 kg)  06/15/18 226 lb (102.5 kg)    Physical Exam Constitutional:      General: He is not in acute distress.    Appearance: He is well-developed.     Comments: NAD  Eyes:     Conjunctiva/sclera: Conjunctivae normal.     Pupils: Pupils are equal, round, and reactive to light.  Neck:      Thyroid: No thyromegaly.     Vascular: No JVD.  Cardiovascular:     Rate and Rhythm: Normal rate and regular rhythm.     Heart sounds: Normal heart sounds. No murmur heard.  No friction rub. No gallop.   Pulmonary:     Effort: Pulmonary effort is normal. No respiratory distress.     Breath sounds: Normal breath sounds. No wheezing or rales.  Chest:     Chest wall: No tenderness.  Abdominal:     General: Bowel sounds are normal. There is no distension.     Palpations: Abdomen is soft. There is no mass.     Tenderness: There is no abdominal tenderness. There is no guarding or rebound.  Musculoskeletal:        General: No tenderness. Normal range of motion.     Cervical back: Normal range of motion.  Lymphadenopathy:     Cervical: No cervical adenopathy.  Skin:    General: Skin is warm and dry.     Findings: No rash.  Neurological:     Mental Status: He is alert and oriented to person, place, and time.     Cranial Nerves: No cranial nerve deficit.     Motor: No abnormal muscle tone.     Coordination: Coordination normal.     Gait: Gait normal.     Deep Tendon Reflexes: Reflexes are normal and symmetric.  Psychiatric:        Behavior: Behavior normal.        Thought Content: Thought content normal.        Judgment: Judgment normal.   rectal- per GI  Lab Results  Component Value Date   WBC 7.3 05/25/2020   HGB 15.1 05/25/2020   HCT 44.7 05/25/2020   PLT 244.0 05/25/2020   GLUCOSE 99 05/25/2020   CHOL 212 (H) 05/25/2020   TRIG 249.0 (H) 05/25/2020   HDL 48.80 05/25/2020   LDLDIRECT 134.0 05/25/2020   LDLCALC 154 (H) 06/12/2015   ALT 34 05/25/2020   AST 21 05/25/2020   NA 140 05/25/2020   K 3.9 05/25/2020   CL 104 05/25/2020   CREATININE 0.96 05/25/2020   BUN 21 05/25/2020   CO2 27 05/25/2020   TSH 1.44 05/25/2020   PSA 1.07 05/25/2020    DG Lumbar Spine 2-3 Views  Result Date: 04/15/2019 CLINICAL DATA:  Lower back pain x 2 days, s/p working in yard, [employer]. EXAM:  LUMBAR SPINE - 2-3 VIEW COMPARISON:  None. FINDINGS: Five non-rib-bearing lumbar-type vertebral bodies. Normal alignment. Vertebral body heights are maintained without evidence of fracture. Mild anterior spurring in the lower lumbar spine as well as facet hypertrophy. No focal bony lesion. Visualized portions of the pelvis are unremarkable. Nonobstructive bowel gas pattern. IMPRESSION: 1. No acute osseous abnormality of the lumbar  spine. 2. Mild lower lumbar degenerative changes. Electronically Signed   By: Audie Pinto M.D.   On: 04/15/2019 14:19    Assessment & Plan:    Walker Kehr, MD

## 2020-05-26 NOTE — Assessment & Plan Note (Signed)
Splints B complex

## 2020-05-26 NOTE — Assessment & Plan Note (Signed)
On Z[facility] calc [facility] ordered

## 2020-05-26 NOTE — Assessment & Plan Note (Signed)
per [place]

## 2020-06-09 ENCOUNTER — Encounter: Payer: Self-pay | Admitting: Internal Medicine

## 2020-07-31 ENCOUNTER — Other Ambulatory Visit: Payer: Self-pay | Admitting: Internal Medicine

## 2021-08-31 ENCOUNTER — Telehealth: Payer: Self-pay | Admitting: *Deleted

## 2021-08-31 NOTE — Telephone Encounter (Signed)
Called patient and left message to see if patient can come in today 08/31/2020 at 3:40 or 4:00 per Dr. Camila Li. If patient calls back please schedule.

## 2021-09-01 ENCOUNTER — Ambulatory Visit: Payer: 59 | Admitting: Internal Medicine

## 2021-09-01 ENCOUNTER — Encounter: Payer: Self-pay | Admitting: Internal Medicine

## 2021-09-01 ENCOUNTER — Other Ambulatory Visit: Payer: Self-pay

## 2021-09-01 DIAGNOSIS — F439 Reaction to severe stress, unspecified: Secondary | ICD-10-CM

## 2021-09-01 DIAGNOSIS — F410 Panic disorder [episodic paroxysmal anxiety] without agoraphobia: Secondary | ICD-10-CM

## 2021-09-01 DIAGNOSIS — R0789 Other chest pain: Secondary | ICD-10-CM

## 2021-09-01 MED ORDER — ALPRAZOLAM 0.5 MG PO TABS: 0.5000 mg | ORAL_TABLET | Freq: Three times a day (TID) | ORAL | 1 refills | Status: DC | PRN

## 2021-09-01 MED ORDER — ESCITALOPRAM OXALATE 5 MG PO TABS: 5.0000 mg | ORAL_TABLET | Freq: Every day | ORAL | 3 refills | Status: DC

## 2021-09-01 NOTE — Progress Notes (Signed)
Subjective:  Patient ID: Stephen Blankenship, male    DOB: 08/28/1970  Age: 51 y.o. MRN: [id number]  CC: anxious (Pt states he may of had a panic attack on Sat, went to UC)   HPI Stephen Blankenship presents for anxiety, stress Broke up w/girlfriend and her dtr - they moved out... He went to UC, had an EKG (normal)- he was put on Zoloft - not helping...   Outpatient Medications Prior to Visit  Medication Sig Dispense Refill   albuterol (PROVENTIL HFA;VENTOLIN HFA) 108 (90 Base) MCG/ACT inhaler Inhale 2 puffs into the lungs every 6 (six) hours as needed for wheezing or shortness of breath. 1 Inhaler 1   EPINEPHrine (EPIPEN JR 2-PAK) 0.15 MG/0.3ML injection Inject 0.3 mLs (0.15 mg total) into the muscle as needed for anaphylaxis. 2 each 1   ezetimibe (ZETIA) 10 MG tablet Take 10 mg by mouth daily.     fluticasone furoate-vilanterol (BREO ELLIPTA) 100-25 MCG/INH AEPB Inhale 1 puff into the lungs daily. 1 each 5   tadalafil (CIALIS) 20 MG tablet TAKE ONE TABLET BY MOUTH DAILY AS NEEDED FOR ERECTILE DYSFUNCTION 30 tablet 5   zolpidem (AMBIEN) 10 MG tablet TAKE 1 TABLET (10 MG TOTAL) BY MOUTH AT BEDTIME AS NEEDED. 30 tablet 2   No facility-administered medications prior to visit.    ROS: Review of Systems  Constitutional:  Negative for appetite change, fatigue and unexpected weight change.  HENT:  Negative for congestion, nosebleeds, sneezing, sore throat and trouble swallowing.   Eyes:  Negative for itching and visual disturbance.  Respiratory:  Negative for cough.   Cardiovascular:  Negative for chest pain, palpitations and leg swelling.  Gastrointestinal:  Negative for abdominal distention, blood in stool, diarrhea and nausea.  Genitourinary:  Negative for frequency and hematuria.  Musculoskeletal:  Negative for back pain, gait problem, joint swelling and neck pain.  Skin:  Negative for rash.  Neurological:  Negative for dizziness, tremors, speech difficulty and weakness.   Psychiatric/Behavioral:  Positive for dysphoric mood and sleep disturbance. Negative for agitation. The patient is nervous/anxious.    Objective:  BP 138/84    Pulse 86    Temp 98.5 F (36.9 C) (Oral)    Ht 6' (1.829 m)    Wt 222 lb 2 oz (100.8 kg)    SpO2 96%    BMI 30.13 kg/m   BP Readings from Last 3 Encounters:  09/01/21 138/84  05/26/20 112/80  04/15/19 130/82    Wt Readings from Last 3 Encounters:  09/01/21 222 lb 2 oz (100.8 kg)  05/26/20 237 lb 6.4 oz (107.7 kg)  04/15/19 237 lb (107.5 kg)    Physical Exam Constitutional:      General: He is not in acute distress.    Appearance: He is well-developed.     Comments: NAD  Eyes:     Conjunctiva/sclera: Conjunctivae normal.     Pupils: Pupils are equal, round, and reactive to light.  Neck:     Thyroid: No thyromegaly.     Vascular: No JVD.  Cardiovascular:     Rate and Rhythm: Normal rate and regular rhythm.     Heart sounds: Normal heart sounds. No murmur heard.   No friction rub. No gallop.  Pulmonary:     Effort: Pulmonary effort is normal. No respiratory distress.     Breath sounds: Normal breath sounds. No wheezing or rales.  Chest:     Chest wall: No tenderness.  Abdominal:     General:  Bowel sounds are normal. There is no distension.     Palpations: Abdomen is soft. There is no mass.     Tenderness: There is no abdominal tenderness. There is no guarding or rebound.  Musculoskeletal:        General: No tenderness. Normal range of motion.     Cervical back: Normal range of motion.  Lymphadenopathy:     Cervical: No cervical adenopathy.  Skin:    General: Skin is warm and dry.     Findings: No rash.  Neurological:     Mental Status: He is alert and oriented to person, place, and time.     Cranial Nerves: No cranial nerve deficit.     Motor: No abnormal muscle tone.     Coordination: Coordination normal.     Gait: Gait normal.     Deep Tendon Reflexes: Reflexes are normal and symmetric.  Psychiatric:         Behavior: Behavior normal.        Thought Content: Thought content normal.        Judgment: Judgment normal.    Lab Results  Component Value Date   WBC 7.3 05/25/2020   HGB 15.1 05/25/2020   HCT 44.7 05/25/2020   PLT 244.0 05/25/2020   GLUCOSE 99 05/25/2020   CHOL 212 (H) 05/25/2020   TRIG 249.0 (H) 05/25/2020   HDL 48.80 05/25/2020   LDLDIRECT 134.0 05/25/2020   LDLCALC 154 (H) 06/12/2015   ALT 34 05/25/2020   AST 21 05/25/2020   NA 140 05/25/2020   K 3.9 05/25/2020   CL 104 05/25/2020   CREATININE 0.96 05/25/2020   BUN 21 05/25/2020   CO2 27 05/25/2020   TSH 1.44 05/25/2020   PSA 1.07 05/25/2020    DG Lumbar Spine 2-3 Views  Result Date: 04/15/2019 CLINICAL DATA:  Lower back pain x 2 days, s/p working in yard, [employer]. EXAM: LUMBAR SPINE - 2-3 VIEW COMPARISON:  None. FINDINGS: Five non-rib-bearing lumbar-type vertebral bodies. Normal alignment. Vertebral body heights are maintained without evidence of fracture. Mild anterior spurring in the lower lumbar spine as well as facet hypertrophy. No focal bony lesion. Visualized portions of the pelvis are unremarkable. Nonobstructive bowel gas pattern. IMPRESSION: 1. No acute osseous abnormality of the lumbar spine. 2. Mild lower lumbar degenerative changes. Electronically Signed   By: Audie Pinto M.D.   On: 04/15/2019 14:19    Assessment & Plan:   Problem List Items Addressed This Visit     Chest pain, atypical    Resolved.  Likely related to panic attacks.  His previous EKG was normal. Cardiology referral offered.      Panic attacks    Discussed.  Psychology referral offered.  Prescribed Lexapro 5 mg at night Alprazolam as needed anxiety/panic attacks.  Potential benefits of a short/long term benzodiazepines  use as well as potential risks  and complications were explained to the patient and were aknowledged.       Relevant Medications   escitalopram (LEXAPRO) 5 MG tablet   ALPRAZolam (XANAX) 0.5 MG tablet    Stress at home    Discussed.  Psychology referral offered.  Prescribed Lexapro 5 mg at night Alprazolam as needed anxiety/panic attacks.  Potential benefits of a short/long term benzodiazepines  use as well as potential risks  and complications were explained to the patient and were aknowledged         Meds ordered this encounter  Medications   escitalopram (LEXAPRO) 5 MG tablet  Sig: Take 1 tablet (5 mg total) by mouth at bedtime.    Dispense:  30 tablet    Refill:  3   ALPRAZolam (XANAX) 0.5 MG tablet    Sig: Take 1 tablet (0.5 mg total) by mouth 3 (three) times daily as needed for anxiety.    Dispense:  60 tablet    Refill:  1      Follow-up: Return in about 4 weeks (around 09/29/2021) for a follow-up visit.  Walker Kehr, MD

## 2021-09-20 DIAGNOSIS — R0789 Other chest pain: Secondary | ICD-10-CM | POA: Insufficient documentation

## 2021-09-20 NOTE — Assessment & Plan Note (Signed)
Discussed.  Psychology referral offered.  Prescribed Lexapro 5 mg at night ?Alprazolam as needed anxiety/panic attacks. ? Potential benefits of a short/long term benzodiazepines  use as well as potential risks  and complications were explained to the patient and were aknowledged ?

## 2021-09-20 NOTE — Assessment & Plan Note (Signed)
Discussed.  Psychology referral offered.  Prescribed Lexapro 5 mg at night ?Alprazolam as needed anxiety/panic attacks. ? Potential benefits of a short/long term benzodiazepines  use as well as potential risks  and complications were explained to the patient and were aknowledged. ? ?

## 2021-09-20 NOTE — Assessment & Plan Note (Signed)
Resolved.  Likely related to panic attacks.  His previous EKG was normal. ?Cardiology referral offered. ?

## 2021-09-23 ENCOUNTER — Other Ambulatory Visit: Payer: Self-pay | Admitting: Internal Medicine

## 2021-10-29 ENCOUNTER — Other Ambulatory Visit: Payer: Self-pay | Admitting: Internal Medicine

## 2022-01-12 ENCOUNTER — Other Ambulatory Visit: Payer: Self-pay | Admitting: Internal Medicine

## 2022-03-05 ENCOUNTER — Other Ambulatory Visit: Payer: Self-pay | Admitting: Internal Medicine

## 2022-03-08 ENCOUNTER — Ambulatory Visit: Payer: 59 | Admitting: Internal Medicine

## 2022-03-08 ENCOUNTER — Encounter: Payer: Self-pay | Admitting: Internal Medicine

## 2022-03-08 DIAGNOSIS — Z91038 Other insect allergy status: Secondary | ICD-10-CM | POA: Insufficient documentation

## 2022-03-08 DIAGNOSIS — H7292 Unspecified perforation of tympanic membrane, left ear: Secondary | ICD-10-CM

## 2022-03-08 DIAGNOSIS — N529 Male erectile dysfunction, unspecified: Secondary | ICD-10-CM

## 2022-03-08 DIAGNOSIS — J4520 Mild intermittent asthma, uncomplicated: Secondary | ICD-10-CM

## 2022-03-08 DIAGNOSIS — Z0000 Encounter for general adult medical examination without abnormal findings: Secondary | ICD-10-CM

## 2022-03-08 DIAGNOSIS — H7290 Unspecified perforation of tympanic membrane, unspecified ear: Secondary | ICD-10-CM | POA: Insufficient documentation

## 2022-03-08 MED ORDER — TADALAFIL 20 MG PO TABS: ORAL_TABLET | ORAL | 5 refills | Status: DC

## 2022-03-08 NOTE — Assessment & Plan Note (Addendum)
L TM w/scars and a perforation at 11 o'clock.  No signs of infection at present.  Follow-up with ENT if needed.

## 2022-03-08 NOTE — Assessment & Plan Note (Addendum)
On Cialis 20 mg prn.  We will continue with current therapy

## 2022-03-08 NOTE — Progress Notes (Signed)
Subjective:  Patient ID: Stephen Blankenship, male    DOB: 1970/11/16  Age: 51 y.o. MRN: [id number]  CC: Follow-up (6 month f/u- Denied flu)   HPI Stephen Blankenship presents for asthma, ED, allergy to hornet venom He goes to [place] regular  Outpatient Medications Prior to Visit  Medication Sig Dispense Refill   albuterol (PROVENTIL HFA;VENTOLIN HFA) 108 (90 Base) MCG/ACT inhaler Inhale 2 puffs into the lungs every 6 (six) hours as needed for wheezing or shortness of breath. 1 Inhaler 1   ALPRAZolam (XANAX) 0.5 MG tablet Take 1 tablet (0.5 mg total) by mouth 3 (three) times daily as needed for anxiety. 60 tablet 1   EPINEPHrine (EPIPEN JR 2-PAK) 0.15 MG/0.3ML injection Inject 0.3 mLs (0.15 mg total) into the muscle as needed for anaphylaxis. 2 each 1   escitalopram (LEXAPRO) 5 MG tablet TAKE 1 TABLET BY MOUTH EVERYDAY AT BEDTIME 90 tablet 2   ezetimibe (ZETIA) 10 MG tablet Take 10 mg by mouth daily.     fluticasone furoate-vilanterol (BREO ELLIPTA) 100-25 MCG/INH AEPB Inhale 1 puff into the lungs daily. 1 each 5   tadalafil (CIALIS) 20 MG tablet TAKE 1 TABLET BY MOUTH DAILY AS NEEDED FOR ERECTILE DYSFUNCTION, SCHEDULE APPOINTMENT WITH PROVIDER AND SCHEDULE LABS FOR FURTHER REFILLS 30 tablet 0   No facility-administered medications prior to visit.    ROS: Review of Systems  Constitutional:  Negative for appetite change, fatigue and unexpected weight change.  HENT:  Negative for congestion, nosebleeds, sneezing, sore throat and trouble swallowing.   Eyes:  Negative for itching and visual disturbance.  Respiratory:  Negative for cough.   Cardiovascular:  Negative for chest pain, palpitations and leg swelling.  Gastrointestinal:  Negative for abdominal distention, blood in stool, diarrhea and nausea.  Genitourinary:  Negative for frequency and hematuria.  Musculoskeletal:  Positive for back pain. Negative for gait problem, joint swelling and neck pain.  Skin:  Negative for rash.  Neurological:   Negative for dizziness, tremors, speech difficulty and weakness.  Psychiatric/Behavioral:  Negative for agitation, dysphoric mood, sleep disturbance and suicidal ideas. The patient is not nervous/anxious.     Objective:  BP 112/72 (BP Location: Left Arm)   Pulse 84   Temp 98.7 F (37.1 C) (Oral)   Ht 6' (1.829 m)   Wt 230 lb (104.3 kg)   SpO2 97%   BMI 31.19 kg/m   BP Readings from Last 3 Encounters:  03/08/22 112/72  09/01/21 138/84  05/26/20 112/80    Wt Readings from Last 3 Encounters:  03/08/22 230 lb (104.3 kg)  09/01/21 222 lb 2 oz (100.8 kg)  05/26/20 237 lb 6.4 oz (107.7 kg)    Physical Exam Constitutional:      General: He is not in acute distress.    Appearance: He is well-developed.     Comments: NAD  Eyes:     Conjunctiva/sclera: Conjunctivae normal.     Pupils: Pupils are equal, round, and reactive to light.  Neck:     Thyroid: No thyromegaly.     Vascular: No JVD.  Cardiovascular:     Rate and Rhythm: Normal rate and regular rhythm.     Heart sounds: Normal heart sounds. No murmur heard.    No friction rub. No gallop.  Pulmonary:     Effort: Pulmonary effort is normal. No respiratory distress.     Breath sounds: Normal breath sounds. No wheezing or rales.  Chest:     Chest wall: No tenderness.  Abdominal:     General: Bowel sounds are normal. There is no distension.     Palpations: Abdomen is soft. There is no mass.     Tenderness: There is no abdominal tenderness. There is no guarding or rebound.  Musculoskeletal:        General: No tenderness. Normal range of motion.     Cervical back: Normal range of motion.  Lymphadenopathy:     Cervical: No cervical adenopathy.  Skin:    General: Skin is warm and dry.     Findings: No rash.  Neurological:     Mental Status: He is alert and oriented to person, place, and time.     Cranial Nerves: No cranial nerve deficit.     Motor: No abnormal muscle tone.     Coordination: Coordination normal.      Gait: Gait normal.     Deep Tendon Reflexes: Reflexes are normal and symmetric.  Psychiatric:        Behavior: Behavior normal.        Thought Content: Thought content normal.        Judgment: Judgment normal.   L TM w/scars and a perforation at 11 o'clock     A total time of 25 minutes was spent preparing to see the patient, reviewing tests and other medical records.  Also, obtaining history and performing comprehensive physical exam.  Additionally, counseling the patient regarding the above listed issues - hornet allergy, ED.   Finally, documenting clinical information in the health records, coordination of care, educating the patient.   Lab Results  Component Value Date   WBC 7.3 05/25/2020   HGB 15.1 05/25/2020   HCT 44.7 05/25/2020   PLT 244.0 05/25/2020   GLUCOSE 99 05/25/2020   CHOL 212 (H) 05/25/2020   TRIG 249.0 (H) 05/25/2020   HDL 48.80 05/25/2020   LDLDIRECT 134.0 05/25/2020   LDLCALC 154 (H) 06/12/2015   ALT 34 05/25/2020   AST 21 05/25/2020   NA 140 05/25/2020   K 3.9 05/25/2020   CL 104 05/25/2020   CREATININE 0.96 05/25/2020   BUN 21 05/25/2020   CO2 27 05/25/2020   TSH 1.44 05/25/2020   PSA 1.07 05/25/2020    DG Lumbar Spine 2-3 Views  Result Date: 04/15/2019 CLINICAL DATA:  Lower back pain x 2 days, s/p working in yard, [employer]. EXAM: LUMBAR SPINE - 2-3 VIEW COMPARISON:  None. FINDINGS: Five non-rib-bearing lumbar-type vertebral bodies. Normal alignment. Vertebral body heights are maintained without evidence of fracture. Mild anterior spurring in the lower lumbar spine as well as facet hypertrophy. No focal bony lesion. Visualized portions of the pelvis are unremarkable. Nonobstructive bowel gas pattern. IMPRESSION: 1. No acute osseous abnormality of the lumbar spine. 2. Mild lower lumbar degenerative changes. Electronically Signed   By: Audie Pinto M.D.   On: 04/15/2019 14:19    Assessment & Plan:   Problem List Items Addressed This Visit      Allergy to hornet venom    Stephen Blankenship has a Rx for Epi-pen, Benadryl, Sudafed, MDI prn      Asthmatic bronchitis    Doing well.  Stephen Blankenship gets inhalers from [place]      Erectile dysfunction    On Cialis 20 mg prn.  We will continue with current therapy      Perforated eardrum    L TM w/scars and a perforation at 11 o'clock.  No signs of infection at present.  Follow-up with ENT if needed.  Well adult exam      Meds ordered this encounter  Medications   tadalafil (CIALIS) 20 MG tablet    Sig: TAKE 1 TABLET BY MOUTH DAILY AS NEEDED FOR ERECTILE DYSFUNCTION    Dispense:  30 tablet    Refill:  5      Follow-up: Return in about 1 year (around 03/09/2023) for a follow-up visit.  Walker Kehr, MD

## 2022-03-08 NOTE — Assessment & Plan Note (Addendum)
Doing well.  Coe gets inhalers from [place]

## 2022-03-08 NOTE — Assessment & Plan Note (Addendum)
Stephen Blankenship has a Rx for Epi-pen, Benadryl, Sudafed, MDI prn

## 2022-04-21 ENCOUNTER — Telehealth: Payer: Self-pay | Admitting: Internal Medicine

## 2022-04-21 NOTE — Telephone Encounter (Signed)
Patient was diagnosed on Tuesday with Covid - He went to [facility] and they prescribed Paxlovid - he does not feel like this is helping and he wants to know if you think that you should prescribe Ivernectin?  Please let patient know if there is anything else he should be doing.

## 2022-04-22 ENCOUNTER — Telehealth (INDEPENDENT_AMBULATORY_CARE_PROVIDER_SITE_OTHER): Payer: 59 | Admitting: Nurse Practitioner

## 2022-04-22 DIAGNOSIS — U071 COVID-19: Secondary | ICD-10-CM | POA: Insufficient documentation

## 2022-04-22 MED ORDER — FLUTICASONE PROPIONATE 50 MCG/ACT NA SUSP: 2.0000 | Freq: Every day | NASAL | 1 refills | Status: DC

## 2022-04-22 NOTE — Assessment & Plan Note (Signed)
Acute, while symptoms are unpleasant they do still appear to be mild.  Recommend patient continue Paxlovid until course has been completed.  Treat symptomatically with as needed DayQuil and NyQuil, ibuprofen for breakthrough myalgias or fevers, albuterol as needed for wheezing/shortness of breath.  Recommend that he drink water as well as walk within his home multiple times a day.  Encouraged to proceed to the emergency department if symptoms worsen over the weekend or call our office on Monday if he would like to be seen for in person evaluation.  Educated that ivermectin and antibiotics are not currently recommended for treatment of acute COVID-19 infection.  Patient reports understanding.  Educated on current quarantine/isolation guidelines.

## 2022-04-22 NOTE — Progress Notes (Signed)
   Established Patient Office Visit  An audio/visual tele-health visit was completed today for this patient. I connected with  Stephen Blankenship on 04/22/22 utilizing audio/visual technology and verified that I am speaking with the correct person using two identifiers. The patient was located at their home, and I was located at the office of [place] at [facility] during the encounter. I discussed the limitations of evaluation and management by telemedicine. The patient expressed understanding and agreed to proceed.     Subjective   Patient ID: Stephen Blankenship, male    DOB: [birth year]  Age: 51 y.o. MRN: [id number]  Chief Complaint  Patient presents with   Covid Positive    Symptoms started 4 days ago. Seen at urgent care and tested negative for flu per patient, positive for covid. Prescribed course of paxlovid. Has history of asthma, has been vaccinated x 2. Having diarrhea (believes this is a side effect of paxlovid). Asking to consider treatment with antibiotic/ivermectin. Has shortness of breath with exertion, has history of asthma denies having had to use his albuterol inhaler for wheezing.     Review of Systems  Constitutional:  Positive for diaphoresis and malaise/fatigue.  HENT:  Positive for congestion.   Respiratory:  Positive for cough (dry) and shortness of breath (with exertion). Negative for wheezing.   Cardiovascular:  Negative for chest pain.  Gastrointestinal:  Positive for diarrhea.      Objective:     There were no vitals taken for this visit. BP Readings from Last 3 Encounters:  03/08/22 112/72  09/01/21 138/84  05/26/20 112/80   Wt Readings from Last 3 Encounters:  03/08/22 230 lb (104.3 kg)  09/01/21 222 lb 2 oz (100.8 kg)  05/26/20 237 lb 6.4 oz (107.7 kg)      Physical Exam Comprehensive physical exam not completed today as office visit was conducted remotely.  Patient appears well over video, does not appear to be in any acute respiratory  distress.  Patient was alert and oriented, and appeared to have appropriate judgment.   No results found for any visits on 04/22/22.    The 10-year ASCVD risk score (Arnett DK, et al., 2019) is: 6.9%* (Cholesterol units were assumed)    Assessment & Plan:   Problem List Items Addressed This Visit       Other   COVID-19 - Primary    Acute, while symptoms are unpleasant they do still appear to be mild.  Recommend patient continue Paxlovid until course has been completed.  Treat symptomatically with as needed DayQuil and NyQuil, ibuprofen for breakthrough myalgias or fevers, albuterol as needed for wheezing/shortness of breath.  Recommend that he drink water as well as walk within his home multiple times a day.  Encouraged to proceed to the emergency department if symptoms worsen over the weekend or call our office on Monday if he would like to be seen for in person evaluation.  Educated that ivermectin and antibiotics are not currently recommended for treatment of acute COVID-19 infection.  Patient reports understanding.  Educated on current quarantine/isolation guidelines.      Relevant Medications   Nirmatrelvir-Ritonavir (PAXLOVID, 150/100, PO)   fluticasone (FLONASE) 50 MCG/ACT nasal spray    Return if symptoms worsen or fail to improve.    Ailene Ards, NP

## 2022-04-24 NOTE — Telephone Encounter (Signed)
There is no need for ivermectin, since he took Paxlovid.  Please schedule office visit if still sick.  Thanks

## 2022-04-25 NOTE — Telephone Encounter (Signed)
Notified pt w/ MD response Pt states he is feeling better he made appt for 05/11/22 for follow-up.[name]

## 2022-05-11 ENCOUNTER — Ambulatory Visit: Payer: 59 | Admitting: Internal Medicine

## 2022-05-11 ENCOUNTER — Encounter: Payer: Self-pay | Admitting: Internal Medicine

## 2022-05-11 VITALS — BP 112/68 | HR 86 | Temp 98.9°F | Ht 70.0 in | Wt 236.0 lb

## 2022-05-11 DIAGNOSIS — U071 COVID-19: Secondary | ICD-10-CM | POA: Diagnosis not present

## 2022-05-11 DIAGNOSIS — F410 Panic disorder [episodic paroxysmal anxiety] without agoraphobia: Secondary | ICD-10-CM | POA: Diagnosis not present

## 2022-05-11 DIAGNOSIS — J4520 Mild intermittent asthma, uncomplicated: Secondary | ICD-10-CM

## 2022-05-11 MED ORDER — TADALAFIL 20 MG PO TABS: ORAL_TABLET | ORAL | 5 refills | Status: DC

## 2022-05-11 NOTE — Patient Instructions (Signed)
For a mild COVID-19 case - take zinc 50 mg a day for 1 week, vitamin C 1000 mg daily for 1 week, vitamin D2 50,000 units weekly for 2 months (unless  taking vitamin D daily already), an antioxidant Quercetin 500 mg twice a day for 1 week (if you can get it quick enough). Take Allegra or Benadryl.  Maintain good oral hydration and take Tylenol for high fever.  Call if problems. Isolate for 5 days, then wear a mask for 5 days per CDC.

## 2022-05-11 NOTE — Progress Notes (Signed)
Subjective:  Patient ID: Stephen Blankenship, male    DOB: [birth year]  Age: 51 y.o. MRN: [id number]  CC: Follow-up (1 month f/u)   HPI JUDAS MOHAMMAD presents for recent COVID - 04/19/22 F/u on asthma, history of panic attacks  Outpatient Medications Prior to Visit  Medication Sig Dispense Refill   albuterol (PROVENTIL HFA;VENTOLIN HFA) 108 (90 Base) MCG/ACT inhaler Inhale 2 puffs into the lungs every 6 (six) hours as needed for wheezing or shortness of breath. 1 Inhaler 1   EPINEPHrine (EPIPEN JR 2-PAK) 0.15 MG/0.3ML injection Inject 0.3 mLs (0.15 mg total) into the muscle as needed for anaphylaxis. 2 each 1   tadalafil (CIALIS) 20 MG tablet TAKE 1 TABLET BY MOUTH DAILY AS NEEDED FOR ERECTILE DYSFUNCTION 30 tablet 5   ezetimibe (ZETIA) 10 MG tablet Take 10 mg by mouth daily. (Patient not taking: Reported on 04/22/2022)     fluticasone (FLONASE) 50 MCG/ACT nasal spray Place 2 sprays into both nostrils daily. (Patient not taking: Reported on 05/11/2022) 16 g 1   fluticasone furoate-vilanterol (BREO ELLIPTA) 100-25 MCG/INH AEPB Inhale 1 puff into the lungs daily. (Patient not taking: Reported on 04/22/2022) 1 each 5   Nirmatrelvir-Ritonavir (PAXLOVID, 150/100, PO) Take by mouth. (Patient not taking: Reported on 05/11/2022)     No facility-administered medications prior to visit.    ROS: Review of Systems  Constitutional:  Negative for appetite change, fatigue and unexpected weight change.  HENT:  Negative for congestion, nosebleeds, sneezing, sore throat and trouble swallowing.   Eyes:  Negative for itching and visual disturbance.  Respiratory:  Negative for cough.   Cardiovascular:  Negative for chest pain, palpitations and leg swelling.  Gastrointestinal:  Negative for abdominal distention, blood in stool, diarrhea and nausea.  Genitourinary:  Negative for frequency and hematuria.  Musculoskeletal:  Negative for back pain, gait problem, joint swelling and neck pain.  Skin:  Negative for  rash.  Neurological:  Negative for dizziness, tremors, speech difficulty and weakness.  Psychiatric/Behavioral:  Negative for agitation, dysphoric mood and sleep disturbance. The patient is not nervous/anxious.     Objective:  BP 112/68 (BP Location: Left Arm)   Pulse 86   Temp 98.9 F (37.2 C) (Oral)   Ht '5\' 10"'$  (1.778 m)   Wt 236 lb (107 kg)   SpO2 95%   BMI 33.86 kg/m   BP Readings from Last 3 Encounters:  05/11/22 112/68  03/08/22 112/72  09/01/21 138/84    Wt Readings from Last 3 Encounters:  05/11/22 236 lb (107 kg)  03/08/22 230 lb (104.3 kg)  09/01/21 222 lb 2 oz (100.8 kg)    Physical Exam Constitutional:      General: He is not in acute distress.    Appearance: Normal appearance. He is well-developed.     Comments: NAD  Eyes:     Conjunctiva/sclera: Conjunctivae normal.     Pupils: Pupils are equal, round, and reactive to light.  Neck:     Thyroid: No thyromegaly.     Vascular: No JVD.  Cardiovascular:     Rate and Rhythm: Normal rate and regular rhythm.     Heart sounds: Normal heart sounds. No murmur heard.    No friction rub. No gallop.  Pulmonary:     Effort: Pulmonary effort is normal. No respiratory distress.     Breath sounds: Normal breath sounds. No wheezing or rales.  Chest:     Chest wall: No tenderness.  Abdominal:     General:  Bowel sounds are normal. There is no distension.     Palpations: Abdomen is soft. There is no mass.     Tenderness: There is no abdominal tenderness. There is no guarding or rebound.  Musculoskeletal:        General: No tenderness. Normal range of motion.     Cervical back: Normal range of motion.  Lymphadenopathy:     Cervical: No cervical adenopathy.  Skin:    General: Skin is warm and dry.     Findings: No rash.  Neurological:     Mental Status: He is alert and oriented to person, place, and time.     Cranial Nerves: No cranial nerve deficit.     Motor: No abnormal muscle tone.     Coordination:  Coordination normal.     Gait: Gait normal.     Deep Tendon Reflexes: Reflexes are normal and symmetric.  Psychiatric:        Behavior: Behavior normal.        Thought Content: Thought content normal.        Judgment: Judgment normal.     Lab Results  Component Value Date   WBC 7.3 05/25/2020   HGB 15.1 05/25/2020   HCT 44.7 05/25/2020   PLT 244.0 05/25/2020   GLUCOSE 99 05/25/2020   CHOL 212 (H) 05/25/2020   TRIG 249.0 (H) 05/25/2020   HDL 48.80 05/25/2020   LDLDIRECT 134.0 05/25/2020   LDLCALC 154 (H) 06/12/2015   ALT 34 05/25/2020   AST 21 05/25/2020   NA 140 05/25/2020   K 3.9 05/25/2020   CL 104 05/25/2020   CREATININE 0.96 05/25/2020   BUN 21 05/25/2020   CO2 27 05/25/2020   TSH 1.44 05/25/2020   PSA 1.07 05/25/2020    DG Lumbar Spine 2-3 Views  Result Date: 04/15/2019 CLINICAL DATA:  Lower back pain x 2 days, s/p working in yard, [employer]. EXAM: LUMBAR SPINE - 2-3 VIEW COMPARISON:  None. FINDINGS: Five non-rib-bearing lumbar-type vertebral bodies. Normal alignment. Vertebral body heights are maintained without evidence of fracture. Mild anterior spurring in the lower lumbar spine as well as facet hypertrophy. No focal bony lesion. Visualized portions of the pelvis are unremarkable. Nonobstructive bowel gas pattern. IMPRESSION: 1. No acute osseous abnormality of the lumbar spine. 2. Mild lower lumbar degenerative changes. Electronically Signed   By: Audie Pinto M.D.   On: 04/15/2019 14:19    Assessment & Plan:   Problem List Items Addressed This Visit     Asthmatic bronchitis - Primary    Continue with albuterol inhaler as needed      COVID-19    Recovering well Patient requested a prescription of ivermectin in case he gets sick with COVID again.  I have no objections.  Prescription provided.      Panic attacks    Overall doing well. Alprazolam as needed anxiety/panic attacks.  Potential benefits of a short/long term benzodiazepines  use as well as  potential risks  and complications were explained to the patient and were aknowledged.         Meds ordered this encounter  Medications   tadalafil (CIALIS) 20 MG tablet    Sig: TAKE 1 TABLET BY MOUTH DAILY AS NEEDED FOR ERECTILE DYSFUNCTION    Dispense:  30 tablet    Refill:  5   ivermectin (STROMECTOL) 3 MG TABS tablet    Sig: Take 4 tablets (12 mg total) by mouth daily.    Dispense:  20 tablet  Refill:  0      Follow-up: Return in about 6 months (around 11/09/2022) for a follow-up visit.  Walker Kehr, MD

## 2022-05-11 NOTE — Assessment & Plan Note (Addendum)
Recovering well Patient requested a prescription of ivermectin in case he gets sick with COVID again.  I have no objections.  Prescription provided.

## 2022-05-14 ENCOUNTER — Encounter: Payer: Self-pay | Admitting: Internal Medicine

## 2022-05-14 MED ORDER — IVERMECTIN 3 MG PO TABS: 12.0000 mg | ORAL_TABLET | Freq: Every day | ORAL | 0 refills | Status: AC

## 2022-05-14 NOTE — Assessment & Plan Note (Signed)
Overall doing well. Alprazolam as needed anxiety/panic attacks.  Potential benefits of a short/long term benzodiazepines  use as well as potential risks  and complications were explained to the patient and were aknowledged.

## 2022-05-14 NOTE — Assessment & Plan Note (Signed)
Continue with albuterol inhaler as needed

## 2023-02-20 ENCOUNTER — Encounter: Payer: Self-pay | Admitting: Internal Medicine

## 2023-02-20 ENCOUNTER — Ambulatory Visit (INDEPENDENT_AMBULATORY_CARE_PROVIDER_SITE_OTHER): Payer: 59 | Admitting: Internal Medicine

## 2023-02-20 VITALS — BP 120/80 | HR 80 | Temp 98.6°F | Ht 70.0 in | Wt 233.0 lb

## 2023-02-20 DIAGNOSIS — Z13220 Encounter for screening for lipoid disorders: Secondary | ICD-10-CM

## 2023-02-20 DIAGNOSIS — Z125 Encounter for screening for malignant neoplasm of prostate: Secondary | ICD-10-CM | POA: Diagnosis not present

## 2023-02-20 DIAGNOSIS — K625 Hemorrhage of anus and rectum: Secondary | ICD-10-CM | POA: Insufficient documentation

## 2023-02-20 DIAGNOSIS — H6691 Otitis media, unspecified, right ear: Secondary | ICD-10-CM | POA: Insufficient documentation

## 2023-02-20 DIAGNOSIS — R6889 Other general symptoms and signs: Secondary | ICD-10-CM | POA: Insufficient documentation

## 2023-02-20 DIAGNOSIS — R030 Elevated blood-pressure reading, without diagnosis of hypertension: Secondary | ICD-10-CM | POA: Insufficient documentation

## 2023-02-20 DIAGNOSIS — R519 Headache, unspecified: Secondary | ICD-10-CM | POA: Insufficient documentation

## 2023-02-20 DIAGNOSIS — F5221 Male erectile disorder: Secondary | ICD-10-CM | POA: Insufficient documentation

## 2023-02-20 DIAGNOSIS — Z0000 Encounter for general adult medical examination without abnormal findings: Secondary | ICD-10-CM

## 2023-02-20 DIAGNOSIS — G4733 Obstructive sleep apnea (adult) (pediatric): Secondary | ICD-10-CM | POA: Insufficient documentation

## 2023-02-20 DIAGNOSIS — M1990 Unspecified osteoarthritis, unspecified site: Secondary | ICD-10-CM | POA: Insufficient documentation

## 2023-02-20 DIAGNOSIS — E785 Hyperlipidemia, unspecified: Secondary | ICD-10-CM | POA: Insufficient documentation

## 2023-02-20 DIAGNOSIS — G8929 Other chronic pain: Secondary | ICD-10-CM | POA: Insufficient documentation

## 2023-02-20 DIAGNOSIS — F419 Anxiety disorder, unspecified: Secondary | ICD-10-CM | POA: Diagnosis not present

## 2023-02-20 DIAGNOSIS — J449 Chronic obstructive pulmonary disease, unspecified: Secondary | ICD-10-CM | POA: Insufficient documentation

## 2023-02-20 MED ORDER — TADALAFIL 20 MG PO TABS: ORAL_TABLET | ORAL | 5 refills | Status: DC

## 2023-02-20 NOTE — Progress Notes (Unsigned)
Subjective:  Patient ID: Stephen Blankenship, male    DOB: 04-01-1971  Age: 52 y.o. MRN: [id number]  CC: Annual Exam   HPI Stephen Blankenship presents for a well exam  Outpatient Medications Prior to Visit  Medication Sig Dispense Refill   acetaZOLAMIDE (DIAMOX) 250 MG tablet Take by mouth.     albuterol (PROVENTIL HFA;VENTOLIN HFA) 108 (90 Base) MCG/ACT inhaler Inhale 2 puffs into the lungs every 6 (six) hours as needed for wheezing or shortness of breath. 1 Inhaler 1   EPINEPHrine (EPIPEN JR 2-PAK) 0.15 MG/0.3ML injection Inject 0.3 mLs (0.15 mg total) into the muscle as needed for anaphylaxis. 2 each 1   ivermectin (STROMECTOL) 3 MG TABS tablet Take 4 tablets (12 mg total) by mouth daily. 20 tablet 0   tadalafil (CIALIS) 20 MG tablet TAKE 1 TABLET BY MOUTH DAILY AS NEEDED FOR ERECTILE DYSFUNCTION 30 tablet 5   No facility-administered medications prior to visit.    ROS: Review of Systems  Constitutional:  Negative for appetite change, fatigue and unexpected weight change.  HENT:  Negative for congestion, nosebleeds, sneezing, sore throat and trouble swallowing.   Eyes:  Negative for itching and visual disturbance.  Respiratory:  Negative for cough.   Cardiovascular:  Negative for chest pain, palpitations and leg swelling.  Gastrointestinal:  Negative for abdominal distention, blood in stool, diarrhea and nausea.  Genitourinary:  Negative for frequency and hematuria.  Musculoskeletal:  Negative for back pain, gait problem, joint swelling and neck pain.  Skin:  Negative for rash.  Neurological:  Negative for dizziness, tremors, speech difficulty and weakness.  Psychiatric/Behavioral:  Negative for agitation, dysphoric mood, sleep disturbance and suicidal ideas. The patient is not nervous/anxious.     Objective:  BP 120/80 (BP Location: Left Arm, Patient Position: Sitting, Cuff Size: Large)   Pulse 80   Temp 98.6 F (37 C) (Oral)   Ht 5\' 10"  (1.778 m)   Wt 233 lb (105.7 kg)   SpO2  94%   BMI 33.43 kg/m   BP Readings from Last 3 Encounters:  02/20/23 120/80  05/11/22 112/68  03/08/22 112/72    Wt Readings from Last 3 Encounters:  02/20/23 233 lb (105.7 kg)  05/11/22 236 lb (107 kg)  03/08/22 230 lb (104.3 kg)    Physical Exam Constitutional:      General: He is not in acute distress.    Appearance: He is well-developed.     Comments: NAD  Eyes:     Conjunctiva/sclera: Conjunctivae normal.     Pupils: Pupils are equal, round, and reactive to light.  Neck:     Thyroid: No thyromegaly.     Vascular: No JVD.  Cardiovascular:     Rate and Rhythm: Normal rate and regular rhythm.     Heart sounds: Normal heart sounds. No murmur heard.    No friction rub. No gallop.  Pulmonary:     Effort: Pulmonary effort is normal. No respiratory distress.     Breath sounds: Normal breath sounds. No wheezing or rales.  Chest:     Chest wall: No tenderness.  Abdominal:     General: Bowel sounds are normal. There is no distension.     Palpations: Abdomen is soft. There is no mass.     Tenderness: There is no abdominal tenderness. There is no guarding or rebound.  Musculoskeletal:        General: No tenderness. Normal range of motion.     Cervical back: Normal range of  motion.  Lymphadenopathy:     Cervical: No cervical adenopathy.  Skin:    General: Skin is warm and dry.     Findings: No rash.  Neurological:     Mental Status: He is alert and oriented to person, place, and time.     Cranial Nerves: No cranial nerve deficit.     Motor: No abnormal muscle tone.     Coordination: Coordination normal.     Gait: Gait normal.     Deep Tendon Reflexes: Reflexes are normal and symmetric.  Psychiatric:        Behavior: Behavior normal.        Thought Content: Thought content normal.        Judgment: Judgment normal.   [facility]  Lab Results  Component Value Date   WBC 7.3 05/25/2020   HGB 15.1 05/25/2020   HCT 44.7 05/25/2020   PLT 244.0 05/25/2020    GLUCOSE 99 05/25/2020   CHOL 212 (H) 05/25/2020   TRIG 249.0 (H) 05/25/2020   HDL 48.80 05/25/2020   LDLDIRECT 134.0 05/25/2020   LDLCALC 154 (H) 06/12/2015   ALT 34 05/25/2020   AST 21 05/25/2020   NA 140 05/25/2020   K 3.9 05/25/2020   CL 104 05/25/2020   CREATININE 0.96 05/25/2020   BUN 21 05/25/2020   CO2 27 05/25/2020   TSH 1.44 05/25/2020   PSA 1.07 05/25/2020    DG Lumbar Spine 2-3 Views  Result Date: 04/15/2019 CLINICAL DATA:  Lower back pain x 2 days, s/p working in yard, [employer]. EXAM: LUMBAR SPINE - 2-3 VIEW COMPARISON:  None. FINDINGS: Five non-rib-bearing lumbar-type vertebral bodies. Normal alignment. Vertebral body heights are maintained without evidence of fracture. Mild anterior spurring in the lower lumbar spine as well as facet hypertrophy. No focal bony lesion. Visualized portions of the pelvis are unremarkable. Nonobstructive bowel gas pattern. IMPRESSION: 1. No acute osseous abnormality of the lumbar spine. 2. Mild lower lumbar degenerative changes. Electronically Signed   By: Emmaline Kluver M.D.   On: 04/15/2019 14:19    Assessment & Plan:   Problem List Items Addressed This Visit     Well adult exam - Primary     We discussed age appropriate health related issues, including available/recomended screening tests and vaccinations. Labs were ordered to be later reviewed . All questions were answered. We discussed one or more of the following - seat belt use, use of sunscreen/sun exposure exercise, safe sex, fall risk reduction, second hand smoke exposure, firearm use and storage, seat belt use, a need for adhering to healthy diet and exercise. Labs were ordered.  All questions were answered. [facility] at [facility]- 2021 A cardiac CT scan for coronary calcium offered [facility]      Relevant Orders   TSH   Urinalysis   CBC with Differential/Platelet   Lipid panel   PSA   Hepatic function panel   Comprehensive metabolic panel   Anxiety disorder,  unspecified    Doing well         Meds ordered this encounter  Medications   tadalafil (CIALIS) 20 MG tablet    Sig: TAKE 1 TABLET BY MOUTH DAILY AS NEEDED FOR ERECTILE DYSFUNCTION    Dispense:  30 tablet    Refill:  5      Follow-up: Return in about 6 months (around 08/23/2023) for a follow-up visit.  Sonda Primes, MD

## 2023-02-20 NOTE — Assessment & Plan Note (Signed)
  We discussed age appropriate health related issues, including available/recomended screening tests and vaccinations. Labs were ordered to be later reviewed . All questions were answered. We discussed one or more of the following - seat belt use, use of sunscreen/sun exposure exercise, safe sex, fall risk reduction, second hand smoke exposure, firearm use and storage, seat belt use, a need for adhering to healthy diet and exercise. Labs were ordered.  All questions were answered. [facility] nl at [facility]- 2021 A cardiac CT scan for coronary calcium offered [facility]

## 2023-02-20 NOTE — Assessment & Plan Note (Signed)
Doing well 

## 2023-02-21 ENCOUNTER — Encounter: Payer: Self-pay | Admitting: Internal Medicine

## 2023-02-21 LAB — LDL CHOLESTEROL, DIRECT: Direct LDL: 146 mg/dL

## 2023-07-25 ENCOUNTER — Ambulatory Visit: Payer: 59 | Admitting: Internal Medicine

## 2023-07-25 ENCOUNTER — Encounter: Payer: Self-pay | Admitting: Internal Medicine

## 2023-07-25 VITALS — BP 126/80 | HR 92 | Temp 98.7°F | Ht 70.0 in | Wt 235.0 lb

## 2023-07-25 DIAGNOSIS — J189 Pneumonia, unspecified organism: Secondary | ICD-10-CM

## 2023-07-25 MED ORDER — AMOXICILLIN-POT CLAVULANATE 875-125 MG PO TABS: 1.0000 | ORAL_TABLET | Freq: Two times a day (BID) | ORAL | 0 refills | Status: AC

## 2023-07-25 MED ORDER — FLUTICASONE PROPIONATE 50 MCG/ACT NA SUSP: 2.0000 | Freq: Every day | NASAL | 6 refills | Status: AC

## 2023-07-25 NOTE — Progress Notes (Signed)
   Subjective:   Patient ID: Stephen Blankenship, male    DOB: 10-Mar-1971, 53 y.o.   MRN: [id number]  HPI The patient is a 53 YO man coming in for left ear pain and cough which is worsening over several weeks. Sputum has changed color and past pneumonia episodes feels similar.   Review of Systems  Constitutional:  Positive for activity change, appetite change and chills. Negative for fatigue, fever and unexpected weight change.  HENT:  Positive for congestion, postnasal drip, rhinorrhea and sinus pressure. Negative for ear discharge, ear pain, sinus pain, sneezing, sore throat, tinnitus, trouble swallowing and voice change.   Eyes: Negative.   Respiratory:  Positive for cough and shortness of breath. Negative for chest tightness and wheezing.   Cardiovascular: Negative.   Gastrointestinal: Negative.   Musculoskeletal:  Positive for myalgias.  Neurological: Negative.     Objective:  Physical Exam Constitutional:      Appearance: He is well-developed.  HENT:     Head: Normocephalic and atraumatic.     Comments: Oropharynx with redness and clear drainage, nose with swollen turbinates, TMs normal bilaterally.  Neck:     Thyroid : No thyromegaly.  Cardiovascular:     Rate and Rhythm: Normal rate and regular rhythm.  Pulmonary:     Effort: Pulmonary effort is normal. No respiratory distress.     Breath sounds: Rhonchi present. No wheezing or rales.     Comments: Right lower lung with rhonchi Abdominal:     Palpations: Abdomen is soft.  Musculoskeletal:        General: Tenderness present.     Cervical back: Normal range of motion.  Lymphadenopathy:     Cervical: No cervical adenopathy.  Skin:    General: Skin is warm and dry.  Neurological:     Mental Status: He is alert and oriented to person, place, and time.     Vitals:   07/25/23 1551  BP: 126/80  Pulse: 92  Temp: 98.7 F (37.1 C)  TempSrc: Oral  SpO2: 97%  Weight: 235 lb (106.6 kg)  Height: 5' 10 (1.778 m)     Assessment & Plan:

## 2023-07-25 NOTE — Patient Instructions (Signed)
 We have sent in the augmentin to take 1 pill twice a day for a week.  We have sent in the flonase to use 2 sprays in each nostril

## 2023-07-26 NOTE — Assessment & Plan Note (Signed)
 Suspected CAP with RLL rhonchi and several weeks of cough with SOB. Rx augmentin  and if not improving CXR needed.

## 2024-03-26 ENCOUNTER — Other Ambulatory Visit: Payer: Self-pay | Admitting: Internal Medicine
